# Patient Record
Sex: Female | Born: 1955 | Race: Black or African American | Hispanic: No | Marital: Married | Smoking: Never smoker
Health system: Southern US, Community
[De-identification: ages and names within clinical notes are randomized; demographics above are authoritative.]

## PROBLEM LIST (undated history)

## (undated) DIAGNOSIS — F319 Bipolar disorder, unspecified: Secondary | ICD-10-CM

## (undated) HISTORY — PX: HERNIA REPAIR: SHX51

## (undated) HISTORY — PX: TONSILLECTOMY: SUR1361

## (undated) HISTORY — PX: ABDOMINAL HYSTERECTOMY: SHX81

## (undated) HISTORY — PX: APPENDECTOMY: SHX54

---

## 1997-10-07 ENCOUNTER — Emergency Department (HOSPITAL_COMMUNITY): Admission: EM | Admit: 1997-10-07 | Discharge: 1997-10-08 | Payer: Self-pay | Admitting: Emergency Medicine

## 1997-10-08 ENCOUNTER — Emergency Department (HOSPITAL_COMMUNITY): Admission: RE | Admit: 1997-10-08 | Discharge: 1997-10-08 | Payer: Self-pay | Admitting: Emergency Medicine

## 1998-05-13 ENCOUNTER — Other Ambulatory Visit: Admission: RE | Admit: 1998-05-13 | Discharge: 1998-05-13 | Payer: Self-pay | Admitting: Internal Medicine

## 1998-05-27 ENCOUNTER — Ambulatory Visit (HOSPITAL_COMMUNITY): Admission: RE | Admit: 1998-05-27 | Discharge: 1998-05-27 | Payer: Self-pay | Admitting: Internal Medicine

## 1998-05-27 ENCOUNTER — Encounter: Payer: Self-pay | Admitting: Internal Medicine

## 1999-04-05 ENCOUNTER — Encounter: Admission: RE | Admit: 1999-04-05 | Discharge: 1999-04-05 | Payer: Self-pay | Admitting: Internal Medicine

## 1999-04-05 ENCOUNTER — Encounter: Payer: Self-pay | Admitting: Internal Medicine

## 1999-06-02 ENCOUNTER — Emergency Department (HOSPITAL_COMMUNITY): Admission: EM | Admit: 1999-06-02 | Discharge: 1999-06-03 | Payer: Self-pay | Admitting: Emergency Medicine

## 2000-03-26 ENCOUNTER — Encounter: Payer: Self-pay | Admitting: Internal Medicine

## 2000-03-26 ENCOUNTER — Encounter: Admission: RE | Admit: 2000-03-26 | Discharge: 2000-03-26 | Payer: Self-pay | Admitting: Internal Medicine

## 2001-12-01 ENCOUNTER — Inpatient Hospital Stay (HOSPITAL_COMMUNITY): Admission: EM | Admit: 2001-12-01 | Discharge: 2001-12-02 | Payer: Self-pay | Admitting: *Deleted

## 2001-12-02 ENCOUNTER — Encounter: Payer: Self-pay | Admitting: Cardiology

## 2004-11-03 ENCOUNTER — Inpatient Hospital Stay (HOSPITAL_COMMUNITY): Admission: RE | Admit: 2004-11-03 | Discharge: 2004-11-18 | Payer: Self-pay | Admitting: Psychiatry

## 2004-11-04 ENCOUNTER — Ambulatory Visit: Payer: Self-pay | Admitting: Psychiatry

## 2004-11-21 ENCOUNTER — Other Ambulatory Visit (HOSPITAL_COMMUNITY): Admission: RE | Admit: 2004-11-21 | Discharge: 2005-02-19 | Payer: Self-pay | Admitting: Psychiatry

## 2004-12-28 ENCOUNTER — Encounter: Payer: Self-pay | Admitting: Emergency Medicine

## 2004-12-28 ENCOUNTER — Inpatient Hospital Stay (HOSPITAL_COMMUNITY): Admission: AD | Admit: 2004-12-28 | Discharge: 2005-01-01 | Payer: Self-pay | Admitting: Internal Medicine

## 2004-12-28 IMAGING — CR DG CHEST 1V PORT
1 series · 1 of 1 positions shown · non-contrast
Comparison: [DATE].

CLINICAL DATA: Chest pain, shortness of breath. 
 PORTABLE CHEST ? [DATE]:

[view not recorded]
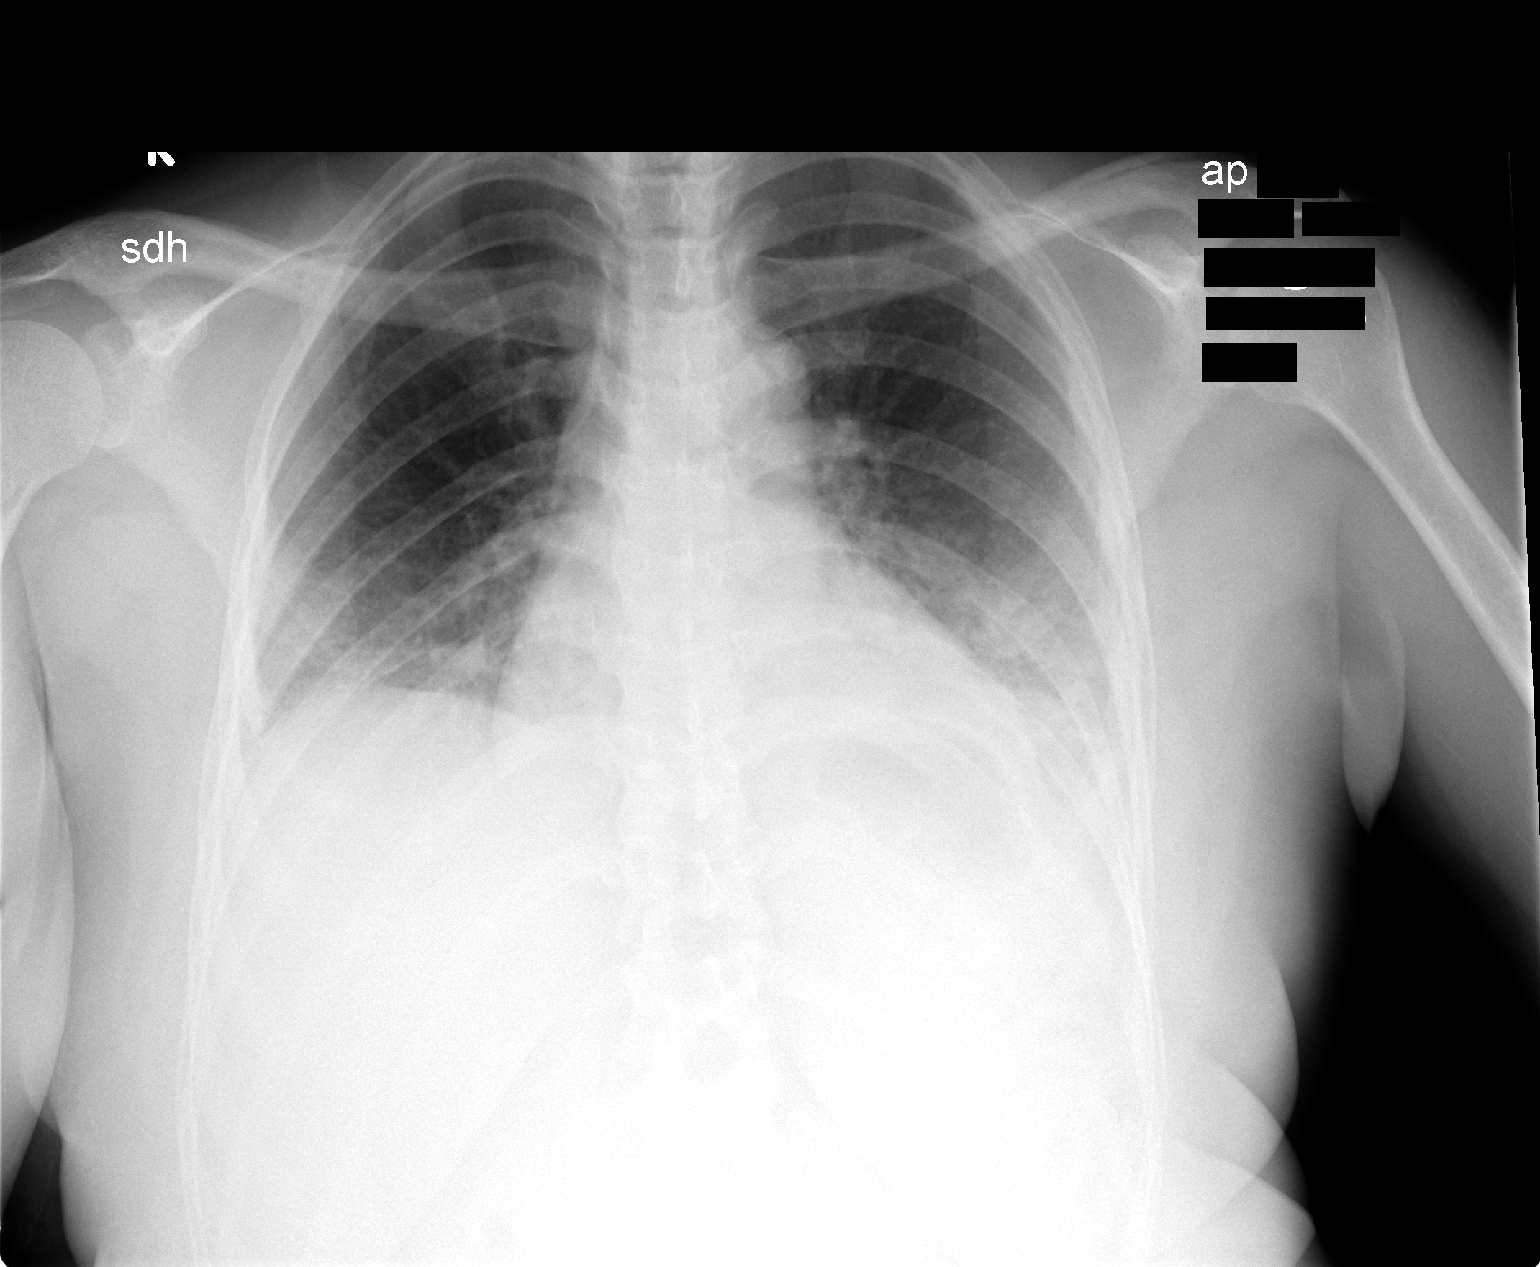

[1 of 1 positions shown; findings below may reference images not displayed]

FINDINGS: There is small bilateral pleural effusions and bibasilar airspace disease.  Cardiomegaly.  Mid and upper lung zones clear.
IMPRESSION: Findings compatible with bibasilar pneumonia.  Edema could create a similar appearance.

## 2004-12-28 IMAGING — CT CT CHEST W/ CM
1 of 2 series · 15 of 30 positions shown, 19 images · IV contrast (omnipaque)
Comparison: none

CLINICAL DATA: 49-year-old with shortness of breath. 
 CHEST CT WITH CONTRAST:
TECHNIQUE: Multidetector CT imaging of the chest was performed following the standard protocol during bolus administration of intravenous contrast.
 Contrast:  80 cc Omnipaque 300

[Series 4: thin chest 2.0 b40f st · axial · 0.55mm/px · z∈[+1181,+1428]mm · 15 of 385 slices shown, 19 images]
[im 17/385  mediastinal]
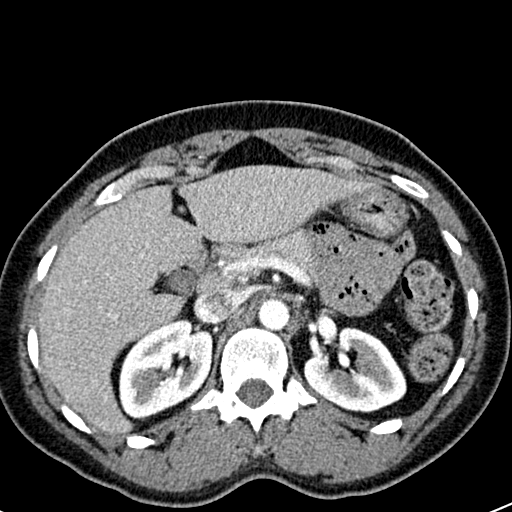
[im 17/385  lung]
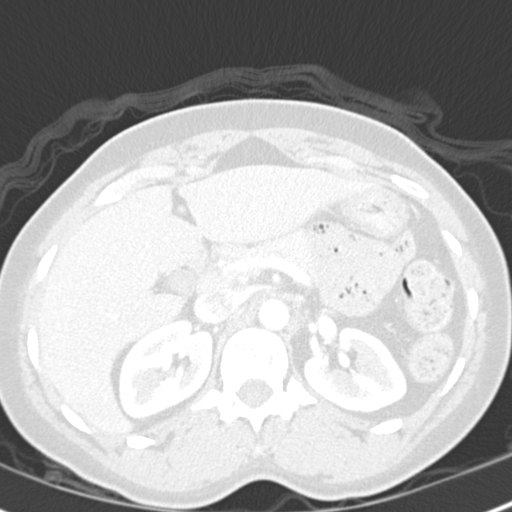
[im 49/385  lung]
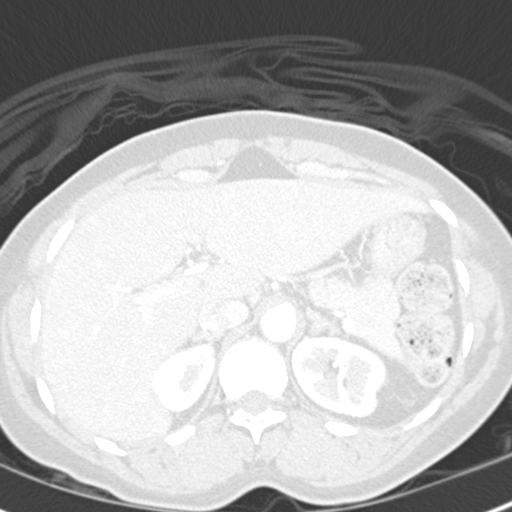
[im 81/385  lung]
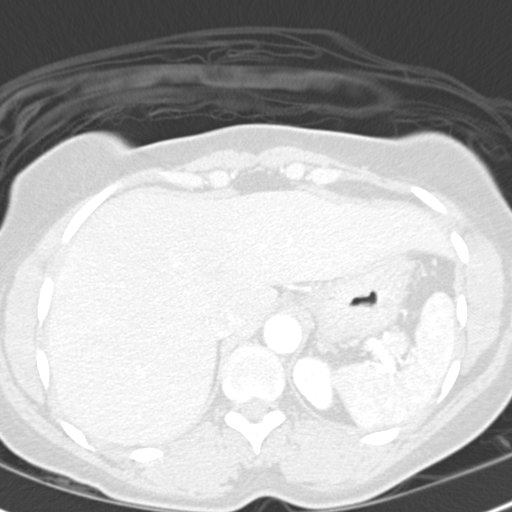
[im 97/385  lung]
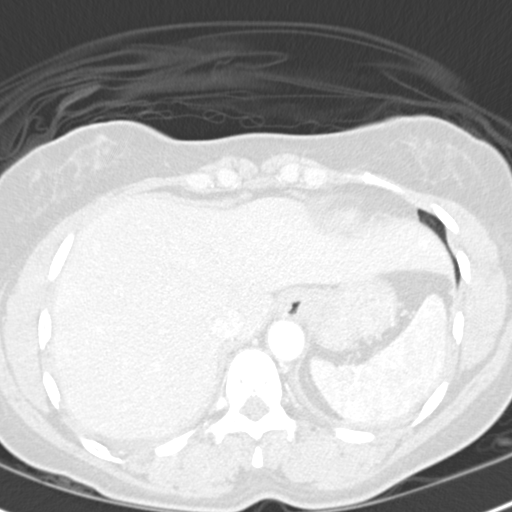
[im 129/385  mediastinal]
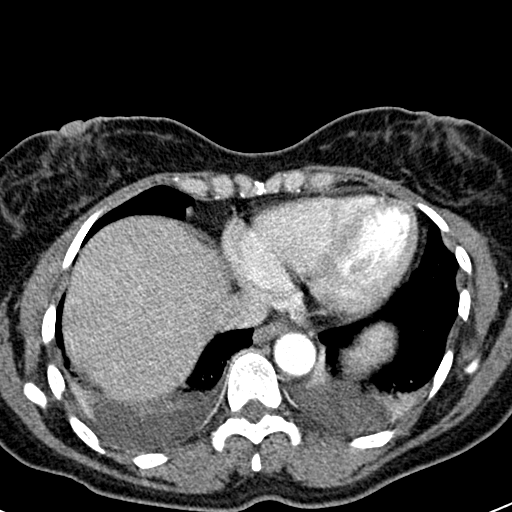
[im 129/385  lung]
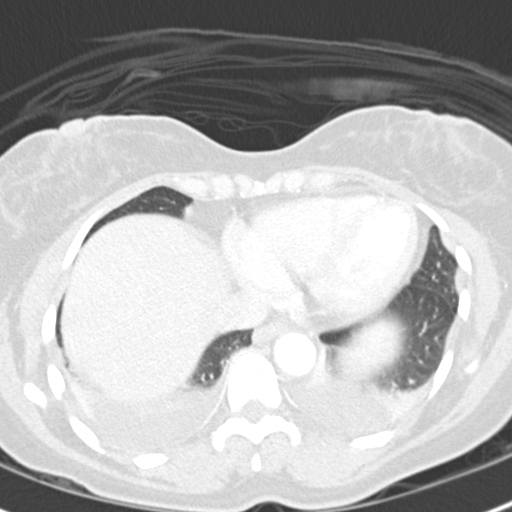
[im 145/385  lung]
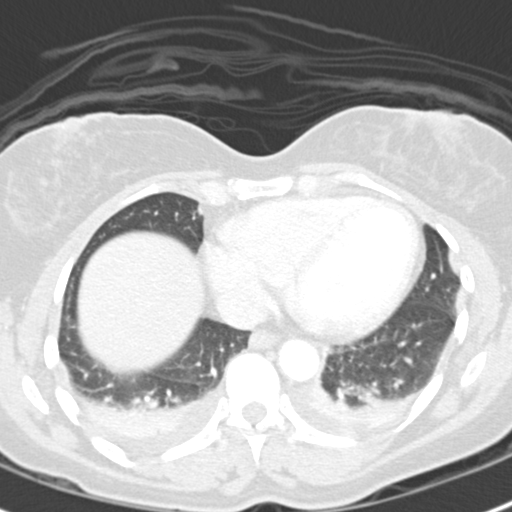
[im 177/385  lung]
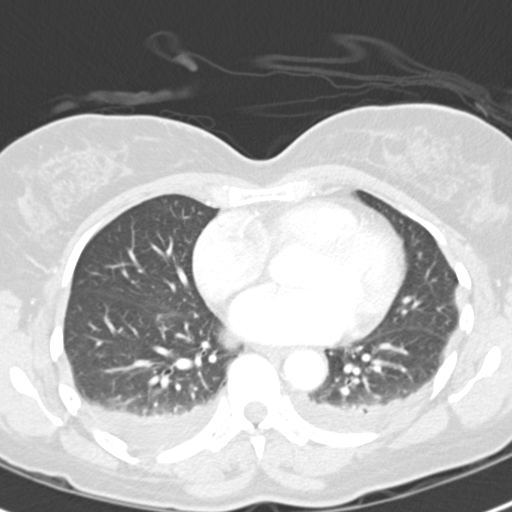
[im 193/385  lung]
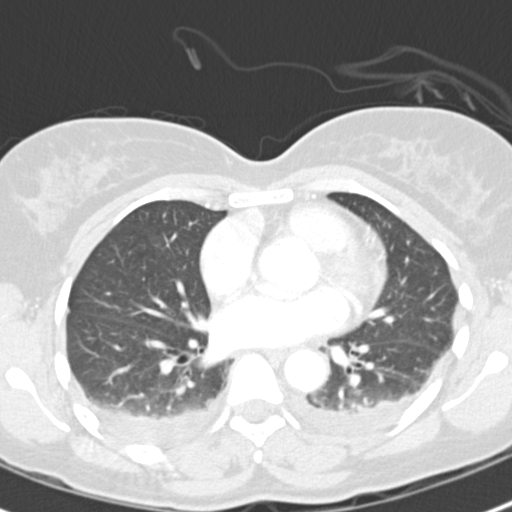
[im 209/385  mediastinal]
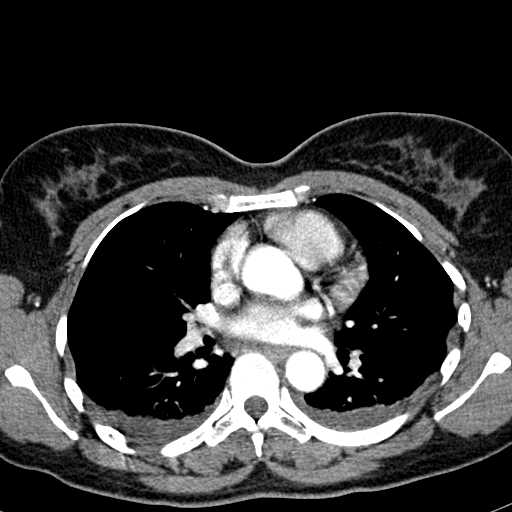
[im 209/385  lung]
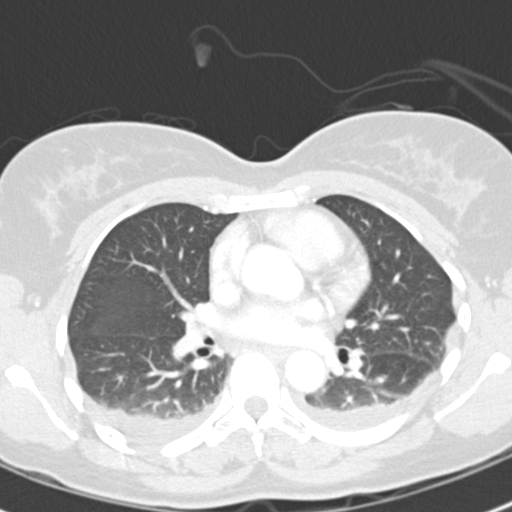
[im 241/385  lung]
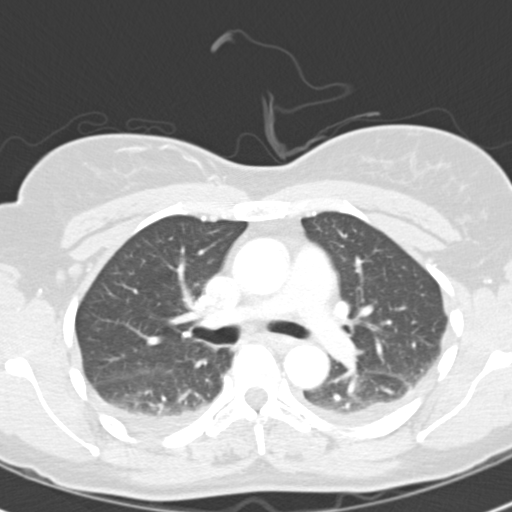
[im 257/385  lung]
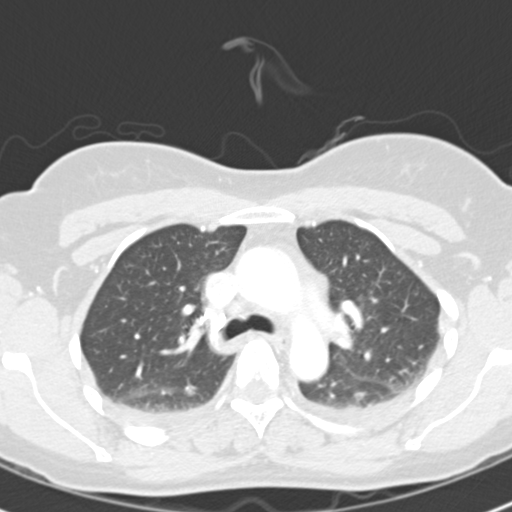
[im 289/385  lung]
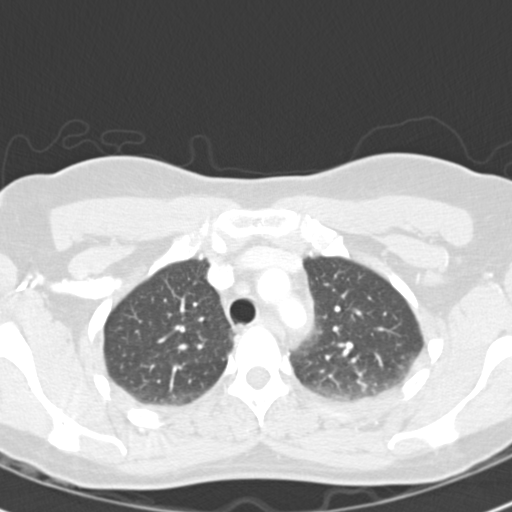
[im 321/385  mediastinal]
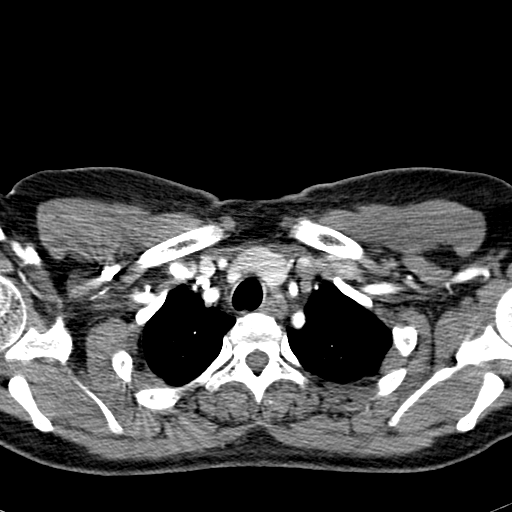
[im 321/385  lung]
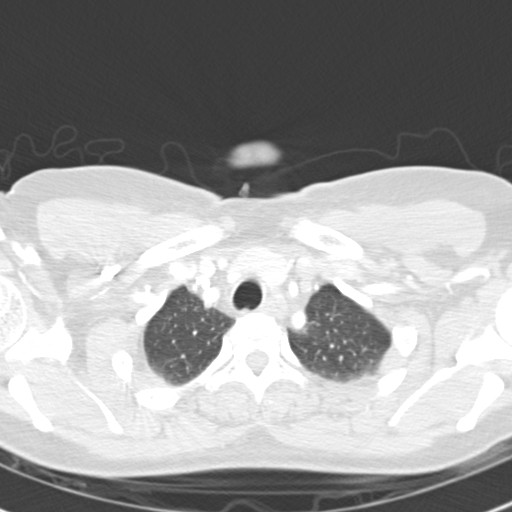
[im 337/385  lung]
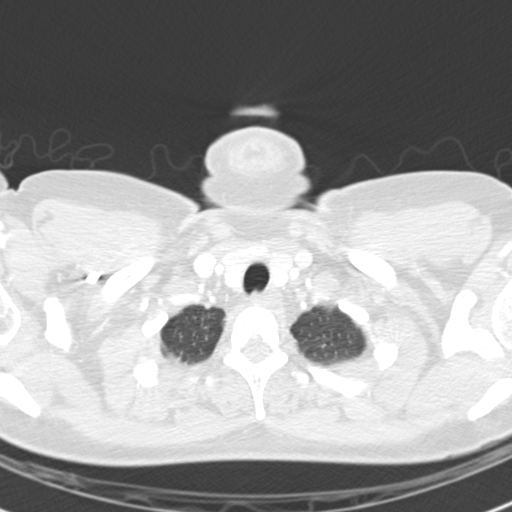
[im 369/385  lung]
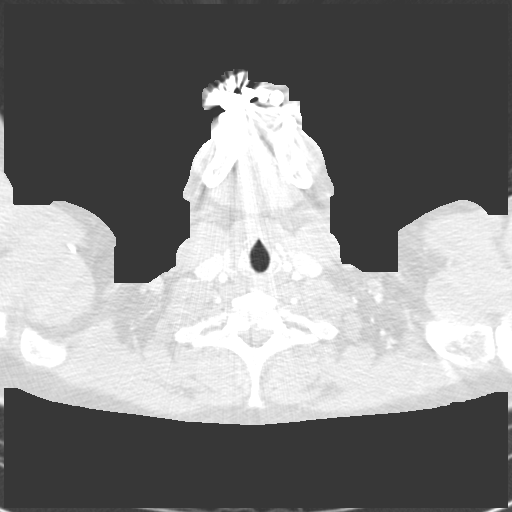

[15 of 30 positions shown; findings below may reference images not displayed]

FINDINGS: The chest wall, soft tissues, and bony structures are unremarkable.  No supraclavicular or axillary adenopathy.  Heart is within normal limits in size.  No pericardial effusion.  No mediastinal or hilar adenopathy.  The thoracic aorta is normal in caliber.  No dissection.  Esophagus is grossly normal.  
 There are small bilateral pleural effusions and overlying bibasilar atelectasis.  No edema or focal infiltrates.  No pulmonary masses or worrisome pulmonary nodules.  
 The upper abdomen demonstrates no significant findings.
IMPRESSION: 1.  Small bilateral pleural effusions and overlying bibasilar atelectasis.  No focal infiltrates or edema. 
 2.  Small hiatal hernia. 
 3.  Normal appearance of the heart and mediastinum.

## 2004-12-30 ENCOUNTER — Ambulatory Visit: Payer: Self-pay | Admitting: Cardiology

## 2004-12-30 ENCOUNTER — Encounter: Payer: Self-pay | Admitting: Cardiology

## 2004-12-31 ENCOUNTER — Ambulatory Visit: Payer: Self-pay | Admitting: Internal Medicine

## 2005-07-12 ENCOUNTER — Ambulatory Visit: Payer: Self-pay | Admitting: Family Medicine

## 2005-07-20 ENCOUNTER — Ambulatory Visit: Payer: Self-pay | Admitting: Family Medicine

## 2005-07-24 ENCOUNTER — Ambulatory Visit (HOSPITAL_COMMUNITY): Admission: RE | Admit: 2005-07-24 | Discharge: 2005-07-24 | Payer: Self-pay | Admitting: Family Medicine

## 2005-08-23 ENCOUNTER — Emergency Department (HOSPITAL_COMMUNITY): Admission: EM | Admit: 2005-08-23 | Discharge: 2005-08-23 | Payer: Self-pay | Admitting: Emergency Medicine

## 2005-11-07 ENCOUNTER — Ambulatory Visit: Payer: Self-pay | Admitting: Family Medicine

## 2005-11-08 ENCOUNTER — Ambulatory Visit: Payer: Self-pay | Admitting: *Deleted

## 2005-11-09 ENCOUNTER — Emergency Department (HOSPITAL_COMMUNITY): Admission: EM | Admit: 2005-11-09 | Discharge: 2005-11-09 | Payer: Self-pay | Admitting: Emergency Medicine

## 2006-07-30 ENCOUNTER — Ambulatory Visit: Payer: Self-pay | Admitting: Internal Medicine

## 2006-08-13 ENCOUNTER — Ambulatory Visit: Payer: Self-pay | Admitting: Internal Medicine

## 2006-10-02 ENCOUNTER — Ambulatory Visit: Payer: Self-pay | Admitting: Internal Medicine

## 2006-10-03 ENCOUNTER — Encounter (INDEPENDENT_AMBULATORY_CARE_PROVIDER_SITE_OTHER): Payer: Self-pay | Admitting: Internal Medicine

## 2006-10-03 LAB — CONVERTED CEMR LAB: TSH: 1.094 microintl units/mL (ref 0.350–5.50)

## 2006-10-30 ENCOUNTER — Encounter (INDEPENDENT_AMBULATORY_CARE_PROVIDER_SITE_OTHER): Payer: Self-pay | Admitting: Internal Medicine

## 2006-10-30 ENCOUNTER — Ambulatory Visit: Payer: Self-pay | Admitting: Family Medicine

## 2006-10-30 LAB — CONVERTED CEMR LAB
ALT: 26 units/L (ref 0–35)
AST: 24 units/L (ref 0–37)
Albumin: 4.5 g/dL (ref 3.5–5.2)
Alkaline Phosphatase: 118 units/L — ABNORMAL HIGH (ref 39–117)
CO2: 18 meq/L — ABNORMAL LOW (ref 19–32)
Glucose, Bld: 84 mg/dL (ref 70–99)
HDL: 66 mg/dL (ref >39)
Total Bilirubin: 0.4 mg/dL (ref 0.3–1.2)
Total Protein: 7.1 g/dL (ref 6.0–8.3)
Triglycerides: 120 mg/dL (ref <150)

## 2006-11-07 ENCOUNTER — Encounter (INDEPENDENT_AMBULATORY_CARE_PROVIDER_SITE_OTHER): Payer: Self-pay | Admitting: *Deleted

## 2006-11-12 ENCOUNTER — Ambulatory Visit (HOSPITAL_COMMUNITY): Admission: RE | Admit: 2006-11-12 | Discharge: 2006-11-12 | Payer: Self-pay | Admitting: Family Medicine

## 2007-03-07 ENCOUNTER — Ambulatory Visit: Payer: Self-pay | Admitting: Internal Medicine

## 2007-03-29 ENCOUNTER — Ambulatory Visit: Payer: Self-pay | Admitting: Internal Medicine

## 2007-03-29 LAB — CONVERTED CEMR LAB
AST: 25 units/L (ref 0–37)
Albumin: 4.8 g/dL (ref 3.5–5.2)
BUN: 12 mg/dL (ref 6–23)
Basophils Relative: 0 % (ref 0–1)
Creatinine, Ser: 1.2 mg/dL (ref 0.40–1.20)
Eosinophils Absolute: 0.1 10*3/uL (ref 0.0–0.7)
Eosinophils Relative: 2 % (ref 0–5)
HCT: 42.3 % (ref 36.0–46.0)
Lymphocytes Relative: 43 % (ref 12–46)
Lymphs Abs: 2.6 10*3/uL (ref 0.7–4.0)
Monocytes Absolute: 0.3 10*3/uL (ref 0.1–1.0)
Monocytes Relative: 4 % (ref 3–12)
Neutrophils Relative %: 51 % (ref 43–77)
Platelets: 285 10*3/uL (ref 150–400)
RBC: 4.89 M/uL (ref 3.87–5.11)
Sodium: 141 meq/L (ref 135–145)
Total Bilirubin: 0.4 mg/dL (ref 0.3–1.2)
Total Protein: 7.4 g/dL (ref 6.0–8.3)
VLDL: 16 mg/dL (ref 0–40)
WBC: 6.2 10*3/uL (ref 4.0–10.5)

## 2007-05-13 ENCOUNTER — Ambulatory Visit: Payer: Self-pay | Admitting: Internal Medicine

## 2009-10-12 ENCOUNTER — Emergency Department (HOSPITAL_COMMUNITY): Admission: EM | Admit: 2009-10-12 | Discharge: 2009-10-12 | Payer: Self-pay | Admitting: Emergency Medicine

## 2010-07-08 NOTE — H&P (Signed)
Brenda Nelson, Brenda Nelson                     ACCOUNT NO.:  [id number]   MEDICAL RECORD NO.:  [id number]                   PATIENT TYPE:  INP   LOCATION:  1826                                 FACILITY:  [facility]   PHYSICIAN:  Learta Codding, M.D. [facility]             DATE OF BIRTH:  05-05-1955   DATE OF ADMISSION:  12/01/2001  DATE OF DISCHARGE:                                HISTORY & PHYSICAL   CARDIOLOGIST:  Cardiologist is new - Dr. Andee Lineman.   CURRENT COMPLAINT:  Substernal chest pain starting earlier this morning.   HISTORY OF PRESENT ILLNESS:  The patient is a 55 year old African-American  female who presents to the emergency room for evaluation of chest pain.  The  patient has no prior history of cardiac disease.  She reports that she was  woken up suddenly this morning with severe 10/10 substernal chest pain which  felt very sharp in nature but then became more oppressive.  This lasted  approximately 10 minutes.  She felt like she needed to hyperventilate when  she developed these symptoms.  She did not have diaphoresis but then had  another episode a little later now associated with a pain around the breast  area and left arm.  She denied any nausea or vomiting at that time.  The  patient has been rather active but lately she does walk 2 miles a day and  reported no substernal chest pain on exertion.  On arrival to the emergency  room the patient's electrocardiogram was within normal limits and did not  demonstrate ischemia.  She was given nitroglycerin and subsequently her  blood pressure dropped into the 80s.  She also felt slightly nauseated thus,  the patient in the bed next to her was vomiting.  Subsequently, her heart  rate decreased and she appeared to have had a vasovagal episode.  The  patient currently still complains of some chest pain in the left chest with  some radiation to the back.  However, subsequent electrocardiogram was  within normal limits and her initial set  of enzymes is also within normal  limits.   ALLERGIES:  No known drug allergies.   MEDICATIONS:  None prior to admission.  The patient was given GI cocktail  and Pepcid in emergency room.   PAST MEDICAL HISTORY:  No history of diabetes or hypertension.  Does have  hypercholesterolemia. Quit tobacco 15 years ago - used to smoke half pack a  day.  Status post hysterectomy at age 46.  She had several cysts removed.  She had a history of anemia in her 71s.   SOCIAL HISTORY:  The patient lives in [place] by herself.  She has a 39-  year-old daughter.  She denies alcohol.  She walks 2 miles a day.  She works  as an Social research officer, government .   FAMILY HISTORY:  Mother had a heart attack in her 42s.  Father died at age  72 and had diabetes mellitus.  She has two sisters with diabetes mellitus  and one sister with hypertension.   REVIEW OF SYSTEMS:  She had reported some fever and sweats.  She also  reports headache.  She reported substernal chest pain with details as  outlined above and had a nonproductive cough.  She denies frequency or  dysuria.  She feels depressed at times.  She has no nausea or vomiting.  She  reports a cold intolerance.   PHYSICAL EXAMINATION:  VITAL SIGNS: Blood pressure is 106/34, heart rate is  61 beats per minute, temperature is 96.7.  GENERAL: Well-nourished African-American female in no apparent distress but  rather anxious and feeling somewhat nauseated.  HEENT: _____________ Conjunctivae clear.  NECK: Supple; no bruits or JVD.  HEART: Regular rate and rhythm; no murmurs audible.  LUNGS: Clear.  SKIN: Warm and dry.  ABDOMEN: Soft, nontender; there is no rebound or guarding; good bowel  sounds.  EXTREMITIES: Extremities 2+ peripheral pulses and no edema.  NEUROLOGICAL: The patient is grossly intact.   DIAGNOSTIC STUDIES:  1. Chest x-ray: No acute abnormalities.  No cardiomegaly.  2. EKG: Normal sinus rhythm.   LABORATORIES:  Hemoglobin is 12.9,  hematocrit is 39.5, white count is 5.0,  platelet count is 264.  BUN is 30 and creatinine is 0.8.  Potassium is 4,  glucose 94.  INR is 0.8.  CK-MB is 147, CK is 183, troponin is less than  0.01.   A 12-lead electrocardiogram had normal sinus rhythm with no acute ischemic  changes.   IMPRESSION AND PLAN:  1. Substernal chest pain.  The etiology is not entirely clear but does not     appear to be ischemic in origin.  The patient has few risk factors for     coronary artery disease.  She also has no electrocardiographic changes     and based on her enzyme markers she is at low risk.  Also, her pain is     reproducible by palpation on the chest.  However, the patient should be     observed and enzymes will be drawn as well as high sensitivity ERCP and a     BNP level.  If this is within normal limits, she can undergo an     inhospital workup for ischemia and heart disease with an exercise     Cardiolite test in the morning.  2. Hypotension.  This may well be vasodepressor in etiology, particularly as     has been associated with the slow heart rate.  Interestingly, the     patient's blood pressure is wide with a low diastolic blood pressure and     we will obtain blood cultures to rule out infection; she is currently not     febrile.  Also, pulmonary embolism will need to be ruled out and a D-     dimer also has been ordered, if positive spiral CT scan will need to be     done.  3. Rule out hypothyroidism.  A TSH will be drawn.   DISPOSITION:  The patient will be admitted, will be given IV fluids and  evaluated for hypertension and chest pain __________ as outlined above.                                               Vernie Shanks  Andee Lineman, M.D. [facility]    GED/MEDQ  D:  12/01/2001  T:  12/01/2001  Job:  [id number]   cc:   Merlene Laughter. Renae Gloss, M.D.

## 2010-07-08 NOTE — Discharge Summary (Signed)
NAMEBUFFI, EWTON           ACCOUNT NO.:  [id number]   MEDICAL RECORD NO.:  [id number]          PATIENT TYPE:  INP   LOCATION:  4730                         FACILITY:  [facility]   PHYSICIAN:  Lonia Blood, M.D.      DATE OF BIRTH:  29-Jun-1955   DATE OF ADMISSION:  12/28/2004  DATE OF DISCHARGE:  01/01/2005                                 DISCHARGE SUMMARY   DISCHARGE DIAGNOSES:  1.  Atypical chest pain, rule out myocardial infarction.  2.  Dyspnea on exertion, presumed noncardiac.  3.  Bipolar disorder.   DISCHARGE MEDICATIONS:  1.  Aspirin 325 mg daily.  2.  Geodon 80 mg daily during the day and 160 mg at night.  3.  Paxil 20 mg daily.  4.  Trazadone 200 mg at night for sleep.   DISPOSITION:  The patient is discharged in good health.   FOLLOW UP:  1.  She will followup with Dr. Renae Gloss in about 1 to 2 weeks.  2.  She is to have an outpatient stress test here at [facility].  3.  Also followup with psychiatrist of choice at mental health after      discharge.   PROCEDURE:  1.  Chest x-ray performed on December 28, 2004 showed findings compatible      with basilar pneumonia or edema, which could create some of that      appearance.  2.  Chest CT on December 28, 2004 showed small bilateral pleural effusions      and overlying bibasilar atelectasis. No focal infiltrates or edema.      Small hiatal hernia and normal appearance of the heart and mediastinum.  3.  A 2-D echocardiogram performed on December 30, 2004 showed an EF of 50%      to 55%. The study was inadequate for evaluation of left ventricular      regional wall motion.   CONSULTATIONS:  1.  Dr. Jeanie Sewer, Psychiatry.  2.  Dr. Lewayne Bunting, Cardiology.   HISTORY AND PHYSICAL:  Please refer to dictated history and physical by Dr.  Corky Downs.   HISTORY OF PRESENT ILLNESS:  The patient is a 55 year old female with known  history of bipolar disorder, recent hospitalization this November to mental  health by  Dr. Kathrynn Running, at which time she was admitted with suicidal  ideation. The patient came in secondary to shortness of breath for about 2  weeks and intermittent chest pain for about a week. She has had multiple  similar episodes before. She had a similar one in 2003 where she had a  Cardiolite test followed by a catheterization, apparently performed also as  an outpatient. The patient was subsequently admitted mainly for rule out  myocardial infarction.   HOSPITAL COURSE:  #1.  CHEST PAIN:  The chest pain was essentially atypical  for the most part. Cardiac enzymes were followed serially and they were  negative. A 2-D echocardiogram performed also showed the findings as  indicated above. We checked a BNP for possible CHF and it was less than 30.  D-dimer was normal but chest CT was performed to further  characterize the  cause of her chest pain as indicated. Subsequently, the patient was managed  conservatively. Cardiology consult was sought for. The recommendation is  that this is probably noncardiac. The patient should have an outpatient  stress test. At this point, we will discharge her home with this premise.  She has not had any chest pain for at least 3 days prior to discharge.   #2.  DYSPNEA ON EXERTION:  No clear-cut cause was found. TSH was normal at  1.686. Her hemoglobin was stable. B12 was normal at 806. As indicated, the 2-  D echocardiogram showed her EF was okay, although at the low normal level.  Chest x-ray initially suggested possible pulmonary edema but CT scan did not  show that. There are some bilateral pleural effusions but they are too small  to account for her dyspnea on exertion. This was explained to the patient  that her medications may be partly responsive being bipolar. Subsequently,  the patient was advised to follow up with mental health so her medications  could be titrated over until she gets maximum benefit.   #3.  BIPOLAR DISORDER:  The patient was on 3  medications when she came in.  Her mother and patient indicated that the patient was overmedicated. We have  asked Dr. Jeanie Sewer to see the patient and his medical assessment  recommendation included adding her adding her Paxil back and titrating the  dose, which we are doing at the moment.      Lonia Blood, M.D.  Electronically Signed     LG/MEDQ  D:  01/01/2005  T:  01/02/2005  Job:  [id number]   cc:   Doylene Canning. Ladona Ridgel, M.D.  [address]  [address]  [place]  [place] [address]   Antonietta Breach, M.D.   [name], M.D.

## 2010-07-08 NOTE — Consult Note (Signed)
Brenda Nelson, Brenda Nelson           ACCOUNT NO.:  [id number]   MEDICAL RECORD NO.:  [id number]          PATIENT TYPE:  INP   LOCATION:  4730                         FACILITY:  [facility]   PHYSICIAN:  Doylene Canning. Ladona Ridgel, M.D.  DATE OF BIRTH:  1956/02/15   DATE OF CONSULTATION:  12/31/2004  DATE OF DISCHARGE:  01/01/2005                                   CONSULTATION   REFERRING SERVICE:  [facility].   INDICATION FOR CONSULTATION:  Evaluation of chest pain and shortness of  breath.   HISTORY OF PRESENT ILLNESS:  The patient is a 55 year old woman with a  history of hypertension and chest discomfort in the past, also with a  history of bipolar disorder, who was admitted to the hospital with worsening  chest pain and shortness of breath.  The patient has a known history of  bipolar disorder.  She states that her initial problems with chest pain  began approximately 5 years ago.  At that time, she underwent stress testing  and subsequently outpatient catheterization at the [facility] and  Vascular Outpatient Cath Lab.  I do not have details of this, but the  patient was told that there was no significant problem.  She was in her  usual state of health until admission December 29, 2004; at that time she  noted dyspnea along with 1 episode of substernal chest pain.  It was not  related to exertion.  It was not associated with diaphoresis, nausea or  vomiting.  There was associated shortness of breath and the pain was short-  lasting and has now resolved.  Subsequent admission demonstrated cardiac  enzymes which were negative for coronary ischemia.  A BNP was negative at  less than 100.  Her D-dimer was also negative.  The patient underwent CT of  the chest which demonstrated small bilateral pleural effusions and bibasilar  atelectasis.  The mediastinum was otherwise normal, as was the cardiac  silhouette.  The patient denies fevers or chills.   PAST MEDICAL HISTORY:   Her past medical history is notable for bipolar  disorder and chest pain as noted before.  There is a questionable history of  hypertension.   FAMILY HISTORY:  Noncontributory.   SOCIAL HISTORY:  The patient denies tobacco or ethanol use.   REVIEW OF SYSTEMS:  The review of systems is negative except as noted in the  HPI.  All other systems were reviewed and found to be negative.   PHYSICAL EXAMINATION:  GENERAL:  The patient is a pleasant middle-aged woman  in no distress.  VITAL SIGNS:  Blood pressure was 100/60, the pulse was 68 and regular,  respirations were 16, temperature was 98, oxygen saturation 96%.  The height  was 5 feet 4 inches and the weight was 160 pounds.  HEENT:  Normocephalic and atraumatic.  Pupils were equal and round.  The  oropharynx was moist.  The sclerae were anicteric.  NECK:  The neck revealed no jugular venous distention.  There was no  thyromegaly.  The trachea was midline.  CARDIOVASCULAR:  Exam revealed a regular rate and rhythm with  normal S1 and  S2.  There were no murmurs, rubs, or gallops present.  ABDOMEN:  Soft, nontender and non-distended.  There was no organomegaly.  Bowel sounds were present and there was no rebound or guarding.  EXTREMITIES:  Extremities demonstrated no cyanosis, clubbing or edema.  The  pulses were 2+ and symmetric.  NEUROLOGIC:  Alert and oriented x3 with cranial nerves intact.  The strength  was 5/5 and symmetric.   LABORATORY AND ACCESSORY CLINICAL DATA:  The EKG demonstrates sinus rhythm  with normal axis and intervals.  There are nonspecific T wave abnormalities.   IMPRESSION:  1.  Chest pain with mostly atypical features with coronary disease and a      reported negative stress test and catheterization 5 years ago.  2.  Dyspnea on exertion of unknown etiology with negative CT of the chest.  3.  Bipolar disorder.   RECOMMENDATIONS:  I would agree that the patient is low risk for cardiac  complications and would  recommend an outpatient stress test at [facility].           ______________________________  Doylene Canning. Ladona Ridgel, M.D.     GWT/MEDQ  D:  12/31/2004  T:  01/02/2005  Job:  [id number]

## 2010-07-08 NOTE — Discharge Summary (Signed)
NAMEMARENDA, ACCARDI                     ACCOUNT NO.:  [id number]   MEDICAL RECORD NO.:  [id number]                   PATIENT TYPE:  INP   LOCATION:  2019                                 FACILITY:  [facility]   PHYSICIAN:  Delton See, P.A. [facility]            DATE OF BIRTH:  1955/03/22   DATE OF ADMISSION:  12/01/2001  DATE OF DISCHARGE:                           DISCHARGE SUMMARY - REFERRING   HISTORY OF PRESENT ILLNESS:  The patient is a 55 year old female with no  previous history of coronary artery disease, who presented to the [place] for an evaluation of chest pain.  The  pain woke her at approximately 5 a.m. on the morning of admission.  She also  had a similar episode on the Tuesday prior to that Saturday.  She states  that she felt bad all day.  She also had sharp pain in her feet x2 weeks.  The pain that awoke her on the morning of admission was described as  substernal and rated as a 10 on a 1/10 scale, associated with mild shortness  of breath.  No diaphoresis, but mild nausea.  The pain also radiated to her  breasts, and her arms felt tingly.  Initially the pain was sharp and later  it became a pressure sensation.  The patient's pain waxed and waned until  she came to the emergency room, where she did receive some relief with  nitroglycerin; however, she continued to have residual pain after the  nitroglycerin.  She was placed on IV nitroglycerin and given morphine, with  improvement in her symptoms.  She was treated with Lovenox anticoagulation.   PAST MEDICAL HISTORY:  Negative for diabetes mellitus, negative for  hypertension.  The patient does have a history of elevated cholesterol  levels.  She quit smoking 15 years ago.  She smoked 1/2 pack per week.  She  had a history of anemia in her 75s.   PAST SURGICAL HISTORY:  1. is status post hysterectomy at age 76.  2. She has had cysts removed.   ALLERGIES:  No known drug  allergies.   MEDICATIONS:  None prior to admission.   SOCIAL HISTORY:  The patient lives in [place] alone.  She has a 28-year-  old daughter, as well as a grandson.  She has a remote tobacco history.  No  alcohol history.  She walks two miles per day for exercise, and is able to  do this without chest pain.  She works as an Engineer, structural.   FAMILY HISTORY:  Her mother is in her 34s, and had a myocardial infarction  approximately three years ago.  Her father died in his 52s of complications  of diabetes mellitus.  She has two sisters with diabetes mellitus.  One  brother with a permanent pacemaker.  One sister with hypertension.   HOSPITAL COURSE:  As noted, this patient was admitted for further evaluation  of chest pain.  Her cardiac enzymes were found to be negative.  She  continued to have intermittent pain as well as mildly low blood pressure,  but no electrocardiogram changes.  The patient was scheduled for an exercise Cardiolite on December 02, 2001.  Her target heart rate was 146 beats per minute.  The patient was only able  to exercise for four minutes and 30 seconds, when she developed leg cramps.  She was no where her target heart rate.  The test was concluded.  She was  changed to an adenosine Cardiolite.  The adenosine Cardiolite was performed  without significant problems, although her blood pressure was mildly low  during this study.  It was in the 90 range.  She was given IV fluids to  improve her hydration status and her blood pressure.  The Cardiolite came back as negative for ischemia, with an ejection fraction  of 755.  Arrangements were made to discharge her in an improved and stable  condition.   LABORATORY DATA:  As noted, the cardiac enzymes were negative.  A lipid  profile is pending at the time of this dictation.  A high sensitive CRP is  also pending.  A comprehensive metabolic panel was within normal limits  except for an albumin of 3.4.  INR  was 0.8, PTT 26.  A CBC was within normal  limits.  A chest x-ray showed no active disease.   DISCHARGE MEDICATIONS:  None.   ACTIVITY:  As tolerated.   DIET:  The patient was told to stay on a low-salt, low-fat diet.   FOLLOW UP:  She is to follow up with Dr. Merlene Laughter. Renae Gloss, and told to  call for an appointment to further evaluate her symptoms.  She is to see Dr.  Learta Codding as needed.   PROBLEMS AT TIME OF DISCHARGE:  1. Chest pain with negative enzymes.  2. Adenosine Cardiolite performed on December 02, 2001, negative for     ischemia, ejection fraction of 75%.  3. Lipid panel pending.  4. Status post hysterectomy.  5. Remote tobacco history.                                               Delton See, P.A. [facility]    DR/MEDQ  D:  12/02/2001  T:  12/03/2001  Job:  [id number]   cc:   Merlene Laughter. Renae Gloss, M.D.

## 2010-07-08 NOTE — H&P (Signed)
[name], Nelson           ACCOUNT NO.:  [id number]   MEDICAL RECORD NO.:  [id number]          PATIENT TYPE:  INP   LOCATION:  NA                           FACILITY:  [facility]   PHYSICIAN:  Mobolaji B. Bakare, M.D.DATE OF BIRTH:  07-28-1955   DATE OF ADMISSION:  DATE OF DISCHARGE:                                HISTORY & PHYSICAL   PRIMARY CARE PHYSICIAN:  Merlene Laughter. Renae Gloss, M.D.   CHIEF COMPLAINT:  1.  Shortness of breath for two weeks.  2.  Chest pain intermittently for one week.   HISTORY OF PRESENTING COMPLAINT:  [name] is a 55 year old African-  American female with history of bipolar disorder, currently on therapy.  She  started experiencing dyspnea on exertion about 2-3 weeks ago, with activity.  She described orthopnea.  She uses three pillows and paroxysmal nocturnal  dyspnea with some coughing, predominantly at night.  There has been no pedal  edema.  She does not have any history of heart disease.  She started  experiencing chest pain five days ago.  She has had three episodes since  then.  Today, she was at therapist's office when she developed left-sided  chest pain with radiation to her arms.  She denies diaphoresis,  palpitations, nausea or vomiting.  Chest pain was stated to be 8/10 and it  resolved spontaneously by the time she arrived in the emergency department.  She was brought to the emergency room by the EMS.  The patient stated that  she did not receive any formal treatment and did not have record of EMS in  the chart.  In any case, at this point, she still has chest pain which she  rated as 4/10.  Earlier today when she had chest pain, it was accompanied by  shortness of breath.   She had similar chest pain in 2003 and she was evaluated with a Cardiolite  stress test which turned out negative.   REVIEW OF SYSTEMS:  CONSTITUTIONAL:  There is no fever, no chill, no  abdominal pain, nausea or vomiting, no dysuria or urgency.   PAST MEDICAL  HISTORY:  Bipolar disorder.   PAST SURGICAL HISTORY:  1.  Hysterectomy at the age of 24 years for vaginal bleeding.  2.  She had an appendectomy in the eighth grade.   MEDICATIONS:  1.  Lamictal 200 mg p.o. q.h.s.  2.  Geodon 80 mg p.o. q.a.m. and 80 mg two tablets p.o. q.h.s.  3.  Trazodone 200 mg p.o. q.h.s.   ALLERGIES:  NO KNOWN DRUG ALLERGIES.   FAMILY HISTORY:  Mother has history of hypertension and has heart disease.  She had a heart attack two years ago.  Sister has hypertension.  Father died  of complication of diabetes mellitus.  She is single, has one daughter who  is 63 years old.   SOCIAL HISTORY:  She never drank alcohol and she never smoked cigarettes.  She does not drink alcohol.   PHYSICAL EXAMINATION:  VITAL SIGNS:  Temperature 97, blood pressure 114/71,  pulse 72, respiratory rate 20, O2 saturation of 96% on room air.  GENERAL:  Exam  reveals the patient is comfortable.  She looks depressed, not  in respiratory distress.  HEENT:  Normocephalic and atraumatic head.  Pupils equal, round and reactive  to light.  Extraocular muscle movements intact.  NECK:  No carotid bruits, no elevated JVD.  LUNGS:  Clear clinically to auscultation.  CVS:  S1 and S2 regular.  No murmur, no gallop, no rub.  ABDOMEN:  Not distended, soft, nontender.  There is an infraumbilical scar.  No hepatomegaly or organomegaly.  Bowel sounds present.  EXTREMITIES:  No pedal edema.  No calf tenderness.  CNS:  No focal neurological deficits.  SKIN:  No rashes or petechiae.   EKG:  Normal sinus rhythm with normal intervals.   Chest x-ray:  Findings are compatible with bibasilar pneumonia.  Edema could  give similar appearance.   LABORATORY DATA:  BNP is 1130.  White cell count 7.4, hemoglobin 13.3,  hematocrit 40.5, platelets 257,000, neutrophils 84%, lymphocytes 10%.  Sodium 130, potassium 4.0, chloride 95, CO2 26, glucose 125, BUN 9,  creatinine 1.2, calcium 9.2.  D-dimer 0.32.  Cardiac  enzymes at the point of  care are negative.   ASSESSMENT AND PLAN:  1.  [name] is a 55 year old African-American female with history of      bipolar disorder, presenting with shortness of breath and dyspnea on      exertion, and chest pain.  She has a low risk for myocardial ischemic.      Chest x-ray shows features compatible with edema versus pneumonia.  The      patient is afebrile and does not have leukocytosis.  Clinically, she      does not appear to have pneumonia.  Given her history of dyspnea on      exertion and chest x-ray findings, we will proceed with a CAT scan of      the chest to further evaluate this.  2.  Chest pain.  We will admit to telemetry and rule out myocardial      infarction.  Start on aspirin 325 mg p.o. daily and add Lopressor 12.5      mg p.o. b.i.d., nitro paste 1/2 inch q.4h., check fasting lipid profile,      serial cardiac enzymes, TSH.  3.  Bipolar disorder.  Resume Lamictal, Geodon and Trazodone.      [name], M.D.  Electronically Signed     MBB/MEDQ  D:  12/28/2004  T:  12/28/2004  Job:  [id number]   cc:   Merlene Laughter. Renae Gloss, M.D.  Fax: [phone]

## 2010-07-08 NOTE — Discharge Summary (Signed)
NAMEPEARLEAN, Brenda NO.:  [id number]   MEDICAL RECORD NO.:  [id number]          PATIENT TYPE:  IPS   LOCATION:  0303                          [facility]ILITY:  [facility]   PHYSICIAN:  Jeanice Lim, M.D. DATE OF BIRTH:  02-Jul-1955   DATE OF ADMISSION:  11/03/2004  DATE OF DISCHARGE:  11/18/2004                                 DISCHARGE SUMMARY   IDENTIFYING DATA:  This 55 year old divorced African-American female  voluntarily admitted, referred by psychotherapist after 2 weeks of suicidal  ideation, sleeping less than 2 hours a night, too tired to go on, does not  feel like she can live any longer, wants to die.  Family stress, conflict  for 2 years over care of blind father, now deceased.  Contemplating means  for suicide.  Unable to work for a month, with impaired concentration and  attention, thought blocking.   PAST PSYCHIATRIC HISTORY:  Sees Dr. __________, primary care physician.  First inpatient admission, no prior psychiatric admissions.  First diagnosed  with depression 8 years ago.   Past medications:  Paxil and Lexapro.  No previous antipsychotics or mood  stabilizers or ECT.   MEDICATIONS:  Lexapro 20 mg daily.   ALLERGIES:  No known drug allergies.   PHYSICAL AND NEUROLOGICAL EXAMINATION:  Within normal limits.   ROUTINE ADMISSION LABS:  Within normal limits.   MENTAL STATUS EXAM:  Fully alert, psychomotor slowing, affective flattening,  cooperative.  Speech:  Some response latency, alogia, soft tone, almost  inaudible.  Mood depressed and hopeless.  Thought processes:  Positive  thought blocking, positive suicidal ideation, unable to construct a clear  plan, but has been working on it.  Poverty of thought, neurovegetatively  depressed.   ADMISSION DIAGNOSES:  AXIS I:  Major depressive disorder, severe, recurrent  with psychotic features.  AXIS II:  Deferred.  AXIS III:  None.  AXIS IV:  Moderate to severe stressors with family conflict.  AXIS  V:  28/65.   HOSPITAL COURSE:  The patient was admitted and ordered routine p.r.n.  medications, underwent further monitoring, was monitored medically.  EKG,  hemoglobin A1C, lipid panel, psychiatric assessment, safety precautions,  routine p.r.n.'s and case management assistance.  Family session was ordered  to mobilize support system.  The patient reported still having some  difficulty sleeping.  The patient refused to interact initially, refused to  answer questions but would speak to some staff, reporting on family session.  The patient was optimized on Risperdal, decreased on Lexapro secondary to  psychotic symptoms, reporting command hallucinations but able to contract  for safety in the hospital as long as closely watched.  The patient reported  a slight decrease in voices, was less profoundly depressed, still irritable,  with suicidal thoughts as well as voices telling her to kill herself.  Female voice, increased at night.  The patient gradually reported a positive  response to medication changes.  EKG showed a first degree heart block, no  history of cardiac problems.  Reviewed by Dr. Rito Ehrlich, who felt that this  was not significant and to repeat it in a few days.  EKG showed normal sinus  rhythm.  The patient asymptomatic.  The patient was continued to be  optimized on medications and closely monitored regarding safety, still  complaining of auditory and visual hallucinations but reported feeling a  little safer and more goal directed as she was stabilized on medications,  reported gradual resolution of psychotic symptoms except for mild paranoid  ideation, no overt psychotic symptoms, no command hallucinations, no  suicidal or homicidal ideation and mood stable with improved judgment and  insight.  The patient was discharged in this improved condition with no side  effects from the medications.  The patient again was given medication  education.  Risk/benefit ratio and  alternative treatments were discussed  regarding medications and the patient was discharged on:  1.  Depakote ER 500 mg 7p.m.  2.  Trazodone 100 mg 1-1/2 at 7 p.m.  3.  Paxil 20 mg daily.  4.  Geodon 80 mg twice a day with food.  5.  Cogentin 0.5 mg b.i.d.  6.  Ativan 0.5 one-half 3 times a day p.r.n.   DISPOSITION:  The patient was to follow up with Dr. Tomasa Rand Monday,  October 9 at 1:30 and with Tamala Fothergill Tuesday, October 4 at 4 p.m., and  [facility], Monday, October 2 at 9 a.m.   DISCHARGE DIAGNOSES:  AXIS I:  Major depressive disorder, severe, recurrent  with psychotic features.  AXIS II:  Deferred.  AXIS III:  None.  AXIS IV:  Moderate to severe stressors with family conflict.  AXIS V:  Global assessment of functioning on discharge was 55.      Jeanice Lim, M.D.  Electronically Signed     JEM/MEDQ  D:  12/21/2004  T:  12/22/2004  Job:  [id number]

## 2010-12-24 ENCOUNTER — Other Ambulatory Visit: Payer: Self-pay

## 2010-12-24 ENCOUNTER — Emergency Department (HOSPITAL_COMMUNITY)
Admission: EM | Admit: 2010-12-24 | Discharge: 2010-12-25 | Disposition: A | Discharge status: Home / Self Care | Payer: Medicare Other | Attending: Emergency Medicine | Admitting: Emergency Medicine

## 2010-12-24 DIAGNOSIS — R079 Chest pain, unspecified: Secondary | ICD-10-CM | POA: Insufficient documentation

## 2010-12-24 DIAGNOSIS — F319 Bipolar disorder, unspecified: Secondary | ICD-10-CM | POA: Insufficient documentation

## 2010-12-24 DIAGNOSIS — Z79899 Other long term (current) drug therapy: Secondary | ICD-10-CM | POA: Insufficient documentation

## 2010-12-25 ENCOUNTER — Emergency Department (HOSPITAL_COMMUNITY): Payer: Medicare Other

## 2010-12-25 LAB — DIFFERENTIAL
Basophils Absolute: 0 K/uL (ref 0.0–0.1)
Basophils Relative: 1 % (ref 0–1)
Eosinophils Absolute: 0.2 10*3/uL (ref 0.0–0.7)
Eosinophils Relative: 4 % (ref 0–5)
Lymphocytes Relative: 50 % — ABNORMAL HIGH (ref 12–46)
Lymphs Abs: 2.9 10*3/uL (ref 0.7–4.0)
Monocytes Absolute: 0.4 K/uL (ref 0.1–1.0)
Monocytes Relative: 7 % (ref 3–12)
Neutro Abs: 2.3 K/uL (ref 1.7–7.7)
Neutrophils Relative %: 39 % — ABNORMAL LOW (ref 43–77)

## 2010-12-25 LAB — POCT I-STAT, CHEM 8
Chloride: 107 mEq/L (ref 96–112)
Creatinine, Ser: 1.3 mg/dL — ABNORMAL HIGH (ref 0.50–1.10)
Glucose, Bld: 104 mg/dL — ABNORMAL HIGH (ref 70–99)
HCT: 38 % (ref 36.0–46.0)
Hemoglobin: 12.9 g/dL (ref 12.0–15.0)
Sodium: 140 mEq/L (ref 135–145)
TCO2: 24 mmol/L (ref 0–100)

## 2010-12-25 LAB — CBC
HCT: 37.7 % (ref 36.0–46.0)
Hemoglobin: 12.5 g/dL (ref 12.0–15.0)
MCH: 28 pg (ref 26.0–34.0)
MCHC: 33.2 g/dL (ref 30.0–36.0)
MCV: 84.5 fL (ref 78.0–100.0)
Platelets: 241 10*3/uL (ref 150–400)
RBC: 4.46 MIL/uL (ref 3.87–5.11)
RDW: 14.6 % (ref 11.5–15.5)
WBC: 5.8 K/uL (ref 4.0–10.5)

## 2010-12-25 LAB — CK TOTAL AND CKMB (NOT AT ARMC)
CK, MB: 3 ng/mL (ref 0.3–4.0)
CK, MB: 3.2 ng/mL (ref 0.3–4.0)
Relative Index: 0.9 (ref 0.0–2.5)
Relative Index: 1 (ref 0.0–2.5)
Total CK: 330 U/L — ABNORMAL HIGH (ref 7–177)
Total CK: 322 U/L — ABNORMAL HIGH (ref 7–177)

## 2010-12-25 LAB — TROPONIN I
Troponin I: <0.3 ng/mL (ref <0.30)
Troponin I: <0.3 ng/mL (ref <0.30)

## 2010-12-25 IMAGING — CR DG CHEST 2V
2 series · 2 of 2 positions shown · non-contrast
Comparison: [DATE] CT

CLINICAL DATA: Right-sided chest pain.

CHEST - 2 VIEW

[w chest pa]
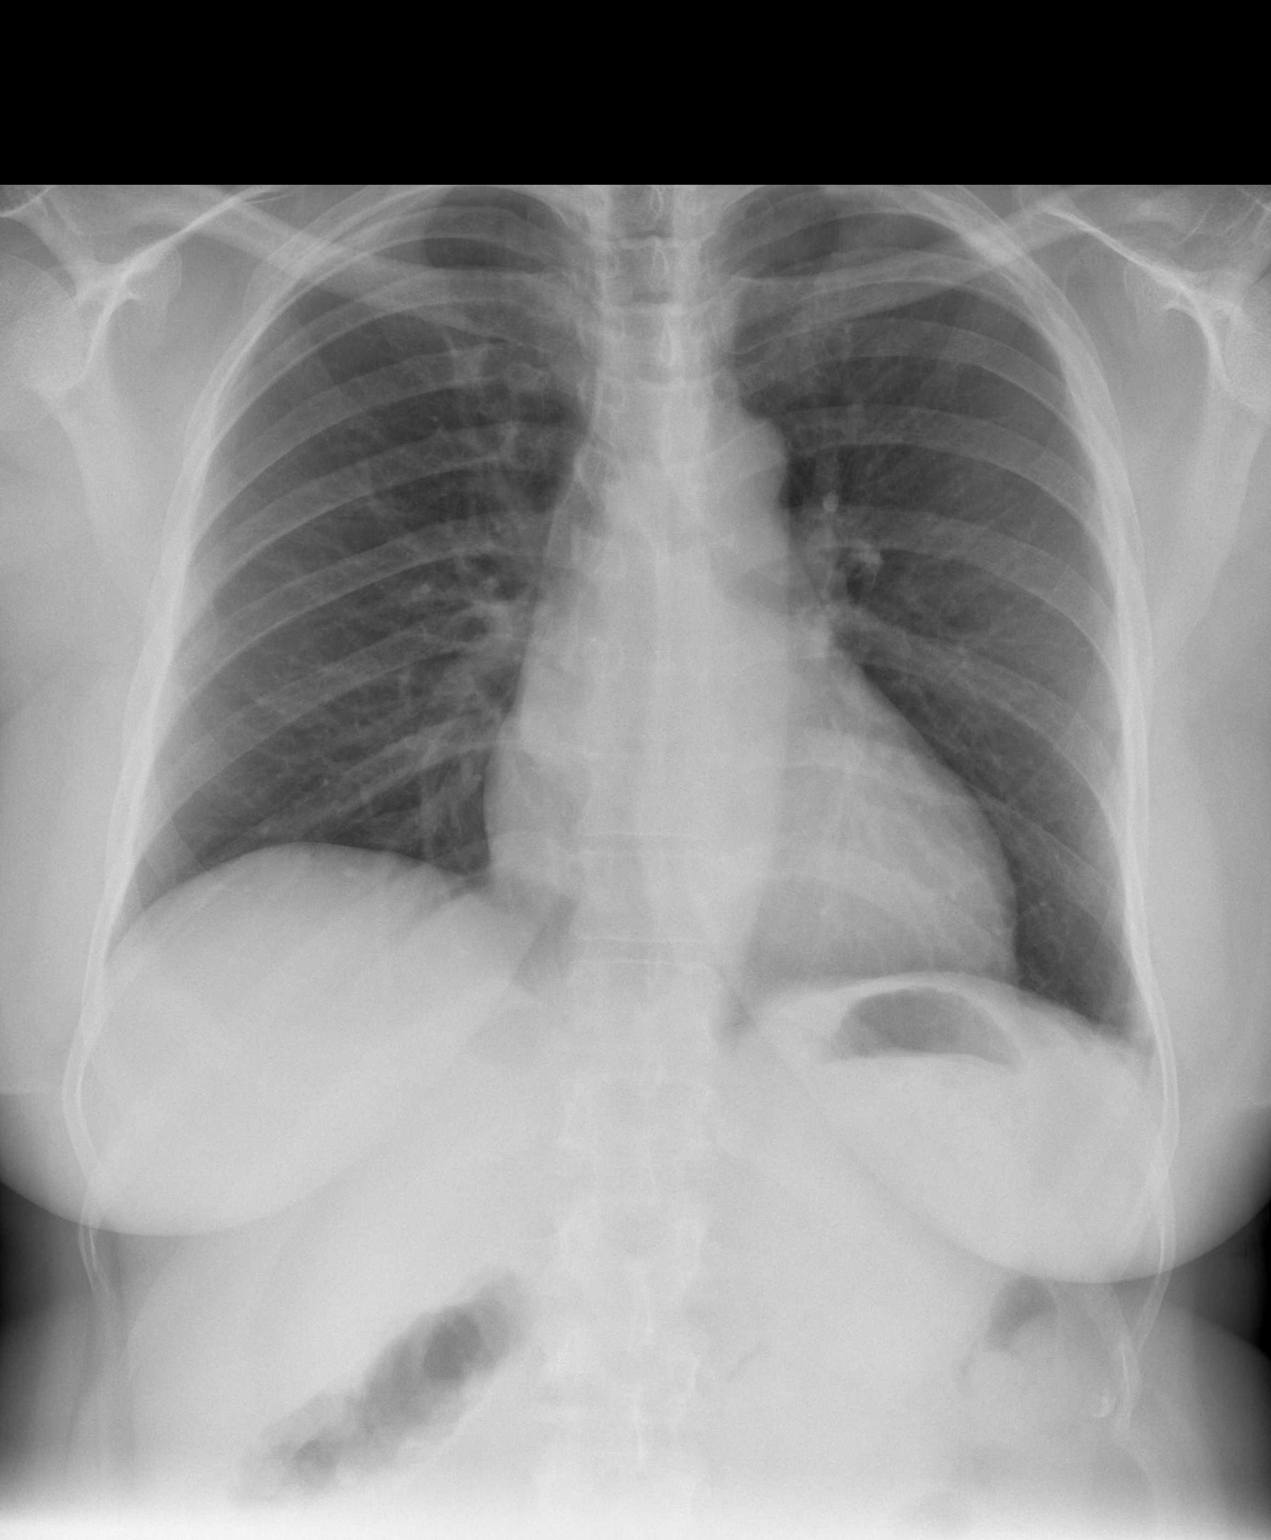

[w chest lat]
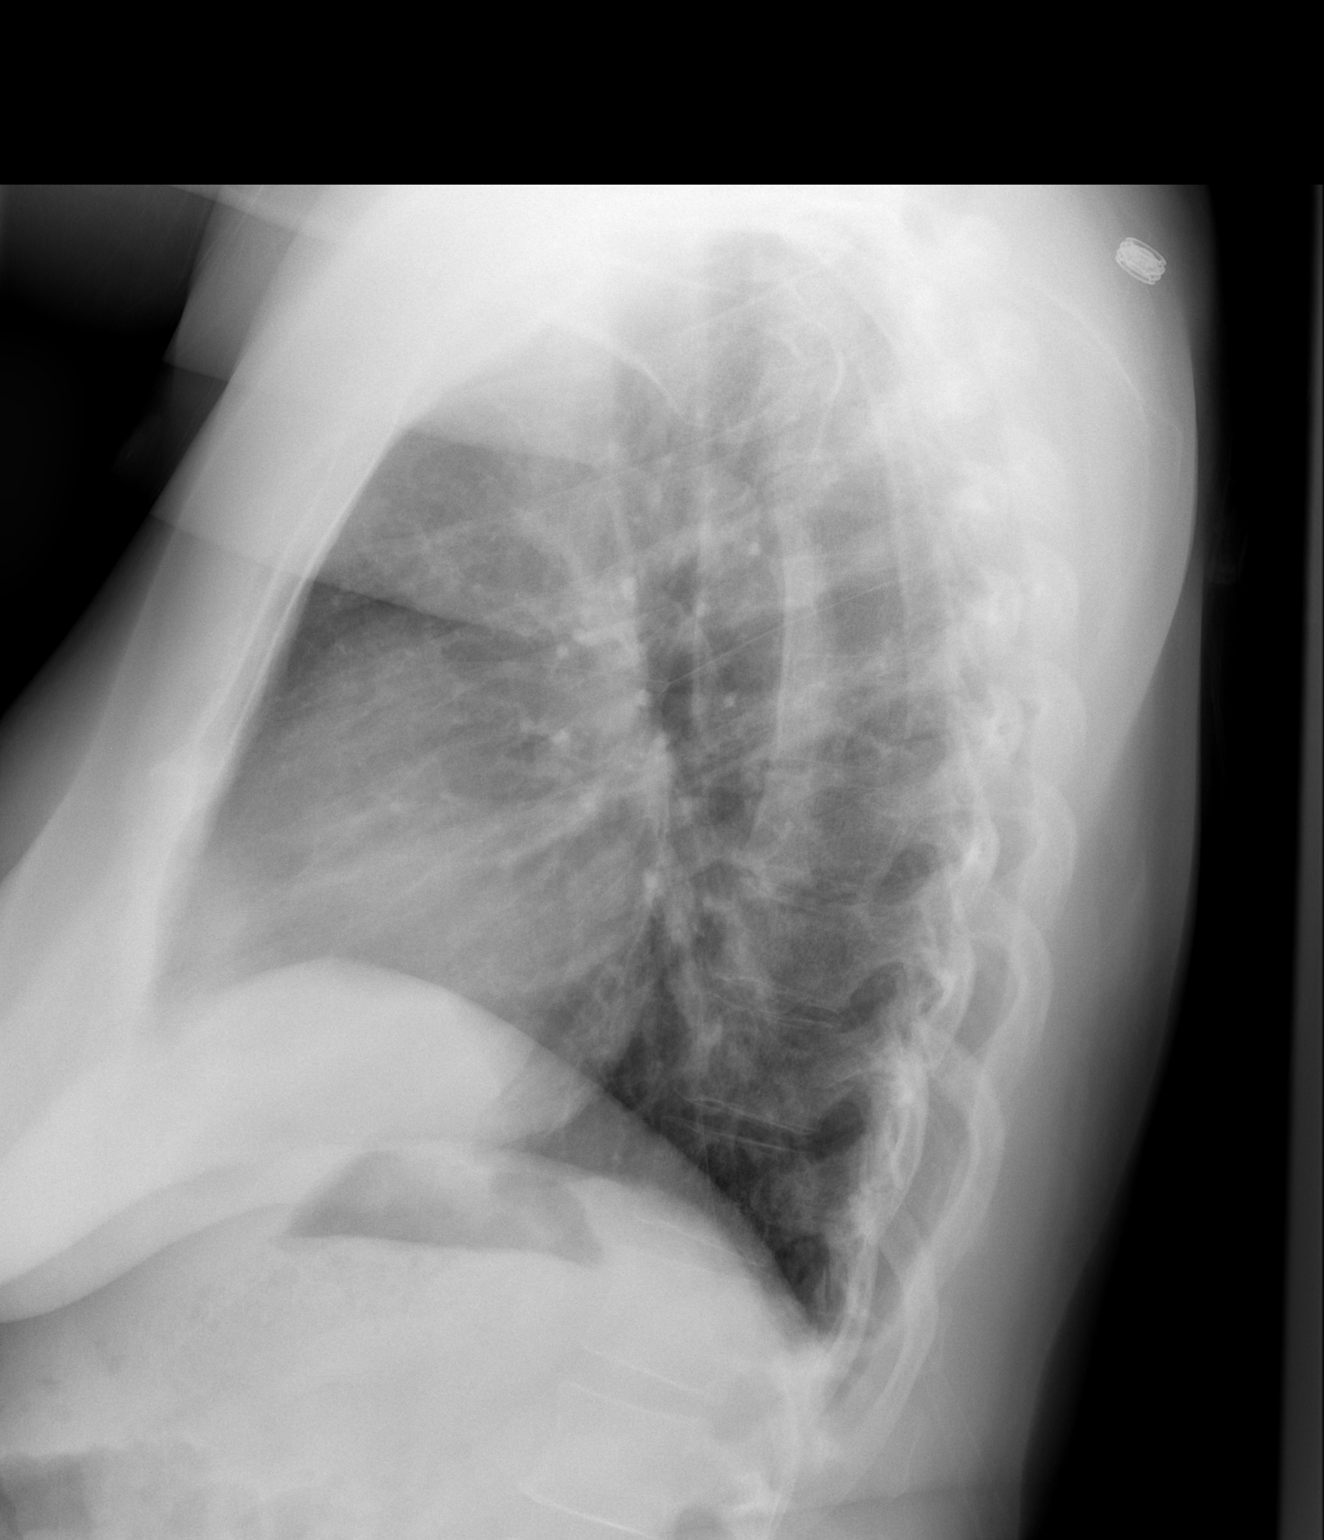

[2 of 2 positions shown; findings below may reference images not displayed]

FINDINGS: Mild left lung base scarring versus atelectasis.  Mild
interstitial prominence.  No focal consolidation otherwise.  No
pleural effusion or pneumothorax identified.  The heart size is
within the upper normal limits.  Mediastinal contours are within
normal limits.  No acute osseous abnormality.
IMPRESSION: Minimal left lung base opacity is likely scarring or atelectasis.
Otherwise, no focal consolidation.

## 2010-12-29 ENCOUNTER — Other Ambulatory Visit (HOSPITAL_COMMUNITY): Payer: Self-pay | Admitting: Internal Medicine

## 2010-12-29 DIAGNOSIS — Z1231 Encounter for screening mammogram for malignant neoplasm of breast: Secondary | ICD-10-CM

## 2011-01-30 ENCOUNTER — Ambulatory Visit (HOSPITAL_COMMUNITY)
Admission: RE | Admit: 2011-01-30 | Discharge: 2011-01-30 | Disposition: A | Discharge status: Home / Self Care | Payer: Medicare Other | Source: Ambulatory Visit | Attending: Internal Medicine | Admitting: Internal Medicine

## 2011-01-30 DIAGNOSIS — Z1231 Encounter for screening mammogram for malignant neoplasm of breast: Secondary | ICD-10-CM | POA: Insufficient documentation

## 2011-12-31 ENCOUNTER — Encounter (HOSPITAL_COMMUNITY): Payer: Self-pay | Admitting: Emergency Medicine

## 2011-12-31 ENCOUNTER — Emergency Department (HOSPITAL_COMMUNITY)
Admission: EM | Admit: 2011-12-31 | Discharge: 2011-12-31 | Disposition: A | Discharge status: Home Health | Payer: Medicare Other | Attending: Emergency Medicine | Admitting: Emergency Medicine

## 2011-12-31 DIAGNOSIS — G4700 Insomnia, unspecified: Secondary | ICD-10-CM | POA: Insufficient documentation

## 2011-12-31 DIAGNOSIS — Z79899 Other long term (current) drug therapy: Secondary | ICD-10-CM | POA: Insufficient documentation

## 2011-12-31 DIAGNOSIS — F411 Generalized anxiety disorder: Secondary | ICD-10-CM | POA: Insufficient documentation

## 2011-12-31 DIAGNOSIS — H5316 Psychophysical visual disturbances: Secondary | ICD-10-CM | POA: Insufficient documentation

## 2011-12-31 DIAGNOSIS — F319 Bipolar disorder, unspecified: Secondary | ICD-10-CM

## 2011-12-31 DIAGNOSIS — Z7982 Long term (current) use of aspirin: Secondary | ICD-10-CM | POA: Insufficient documentation

## 2011-12-31 HISTORY — DX: Bipolar disorder, unspecified: F31.9

## 2011-12-31 MED ORDER — ZIPRASIDONE HCL 80 MG PO CAPS
80.0000 mg | ORAL_CAPSULE | Freq: Once | ORAL | Status: AC
  Administered 2011-12-31: 80 mg via ORAL
  Filled 2011-12-31: qty 1

## 2011-12-31 MED ORDER — LAMOTRIGINE 100 MG PO TABS
100.0000 mg | ORAL_TABLET | Freq: Once | ORAL | Status: AC
  Administered 2011-12-31: 100 mg via ORAL
  Filled 2011-12-31: qty 1

## 2011-12-31 MED ORDER — HYDROXYZINE HCL 25 MG PO TABS
50.0000 mg | ORAL_TABLET | Freq: Once | ORAL | Status: AC
  Administered 2011-12-31: 50 mg via ORAL
  Filled 2011-12-31: qty 2

## 2011-12-31 NOTE — ED Notes (Signed)
Pt reports that she is bipolar and been out of her meds since Thursday. Pt reports that she is unable to get her medications til Thursday. Pt c/o hallucinations of hearing voices and seeing things. Pt reports that "the voices are just babbling." Pt denies Suicidal or homicidal ideation at this time.

## 2011-12-31 NOTE — ED Provider Notes (Signed)
History     CSN: [id number]  Arrival date & time 12/31/11  7829   First MD Initiated Contact with Patient 12/31/11 [phone]      Chief Complaint  Patient presents with  . Medication Refill    (Consider location/radiation/quality/duration/timing/severity/associated sxs/prior treatment) HPI Comments: Patient with history of bipolar disorder presents today with complaint of insomnia, visual/auditory hallucinations, tremors that she states is due to being out of her medications for the past 3 days. Patient is currently taking Vistaril, Lamictal, Geodon. She states that she was not able to get her medications for another 4 days due to financial reasons. Patient denies any other medical complaints currently. Patient states that she hears voices and the sound of slamming doors. Voices are nonobstructive and the patient does not have any thoughts of harming herself or others. Onset gradual. Course is constant. Nothing makes symptoms better or worse.  The history is provided by the patient.    Past Medical History  Diagnosis Date  . Bipolar disorder     Past Surgical History  Procedure Date  . Abdominal hysterectomy   . Appendectomy     No family history on file.  History  Substance Use Topics  . Smoking status: Never Smoker   . Smokeless tobacco: Not on file  . Alcohol Use: No    OB History    Grav Para Term Preterm Abortions TAB SAB Ect Mult Living                  Review of Systems  Constitutional: Negative for fever.  HENT: Negative for sore throat and rhinorrhea.   Eyes: Negative for redness.  Respiratory: Negative for cough.   Cardiovascular: Negative for chest pain.  Gastrointestinal: Negative for nausea, vomiting, abdominal pain and diarrhea.  Musculoskeletal: Negative for myalgias.  Skin: Negative for rash.  Neurological: Negative for headaches.  Psychiatric/Behavioral: Positive for hallucinations and sleep disturbance. Negative for suicidal ideas. The patient is  nervous/anxious.     Allergies  Review of patient's allergies indicates no known allergies.  Home Medications   Current Outpatient Rx  Name  Route  Sig  Dispense  Refill  . ASPIRIN EC 81 MG PO TBEC   Oral   Take 81 mg by mouth daily. Takes 2 tablets when needed for chest pain          . HYDROXYZINE PAMOATE 25 MG PO CAPS   Oral   Take 50-100 mg by mouth every evening. sleep          . LAMOTRIGINE 100 MG PO TABS   Oral   Take 100 mg by mouth 2 (two) times daily.           Marland Kitchen ZIPRASIDONE HCL 80 MG PO CAPS   Oral   Take 240 mg by mouth every evening.             BP 126/79  Pulse 75  Temp 98.3 F (36.8 C) (Oral)  Resp 16  SpO2 95%  Physical Exam  Nursing note and vitals reviewed. Constitutional: She is oriented to person, place, and time. She appears well-developed and well-nourished.  HENT:  Head: Normocephalic and atraumatic.  Eyes: Conjunctivae normal are normal. Right eye exhibits no discharge. Left eye exhibits no discharge.  Neck: Normal range of motion. Neck supple.  Cardiovascular: Normal rate, regular rhythm and normal heart sounds.   Pulmonary/Chest: Effort normal and breath sounds normal.  Abdominal: Soft. There is no tenderness.  Neurological: She is alert and oriented to  person, place, and time. GCS eye subscore is 4. GCS verbal subscore is 5. GCS motor subscore is 6.       Patient is tremulous  Skin: Skin is warm and dry.  Psychiatric: She has a normal mood and affect. She expresses no homicidal and no suicidal ideation.    ED Course  Procedures (including critical care time)  Labs Reviewed - No data to display No results found.   1. Bipolar disorder     9:26 AM Patient seen and examined.   Vital signs reviewed and are as follows: Filed Vitals:   12/31/11 0904  BP: 126/79  Pulse: 75  Temp: 98.3 F (36.8 C)  Resp: 16   9:48 AM D/w Dr. Jeraldine Loots. She has health insurance but cannot afford to get them until she gets disability check  on Wednesday (3 days). She went to her [facility] but they could not help because a doctor was there. She thinks maybe she can get help tomorrow. Because she has insurance, she cannot get meds through [facility].   Will give dose of meds here and have patient follow-up with psychiatrist tomorrow.    MDM  Bipolar, off meds x 3 days and cannot get them. Non-directive hallucinations. No SI/HI. Meds here, follow-up with psychiatrist tomorrow.         [place], [place] 12/31/11 [phone]

## 2011-12-31 NOTE — ED Provider Notes (Signed)
Medical screening examination/treatment/procedure(s) were performed by non-physician practitioner and as supervising physician I was immediately available for consultation/collaboration.  Rowe Warman, MD 12/31/11 1003 

## 2011-12-31 NOTE — ED Notes (Signed)
Pt discharged to home with family. NAD.  

## 2012-05-02 ENCOUNTER — Ambulatory Visit (HOSPITAL_COMMUNITY): Payer: Medicare Other | Admitting: Psychiatry

## 2012-06-11 ENCOUNTER — Ambulatory Visit (HOSPITAL_COMMUNITY): Payer: Medicare Other | Admitting: Psychiatry

## 2014-06-11 ENCOUNTER — Ambulatory Visit (HOSPITAL_COMMUNITY): Payer: Self-pay | Admitting: Psychiatry

## 2014-08-05 DIAGNOSIS — Z1231 Encounter for screening mammogram for malignant neoplasm of breast: Secondary | ICD-10-CM | POA: Diagnosis not present

## 2014-10-23 DIAGNOSIS — Z124 Encounter for screening for malignant neoplasm of cervix: Secondary | ICD-10-CM | POA: Diagnosis not present

## 2014-10-23 DIAGNOSIS — F319 Bipolar disorder, unspecified: Secondary | ICD-10-CM | POA: Diagnosis not present

## 2014-10-23 DIAGNOSIS — Z1389 Encounter for screening for other disorder: Secondary | ICD-10-CM | POA: Diagnosis not present

## 2014-10-23 DIAGNOSIS — Z131 Encounter for screening for diabetes mellitus: Secondary | ICD-10-CM | POA: Diagnosis not present

## 2014-10-23 DIAGNOSIS — Z13220 Encounter for screening for lipoid disorders: Secondary | ICD-10-CM | POA: Diagnosis not present

## 2014-10-23 DIAGNOSIS — Z0001 Encounter for general adult medical examination with abnormal findings: Secondary | ICD-10-CM | POA: Diagnosis not present

## 2015-02-17 ENCOUNTER — Emergency Department (HOSPITAL_COMMUNITY): Payer: Medicare Other

## 2015-02-17 ENCOUNTER — Encounter (HOSPITAL_COMMUNITY): Payer: Self-pay | Admitting: Emergency Medicine

## 2015-02-17 ENCOUNTER — Emergency Department (HOSPITAL_COMMUNITY)
Admission: EM | Admit: 2015-02-17 | Discharge: 2015-02-17 | Disposition: A | Discharge status: Home / Self Care | Payer: Medicare Other | Attending: Emergency Medicine | Admitting: Emergency Medicine

## 2015-02-17 DIAGNOSIS — Z79899 Other long term (current) drug therapy: Secondary | ICD-10-CM | POA: Diagnosis not present

## 2015-02-17 DIAGNOSIS — F319 Bipolar disorder, unspecified: Secondary | ICD-10-CM | POA: Insufficient documentation

## 2015-02-17 DIAGNOSIS — Z7982 Long term (current) use of aspirin: Secondary | ICD-10-CM | POA: Diagnosis not present

## 2015-02-17 DIAGNOSIS — R079 Chest pain, unspecified: Secondary | ICD-10-CM | POA: Diagnosis not present

## 2015-02-17 DIAGNOSIS — R202 Paresthesia of skin: Secondary | ICD-10-CM | POA: Insufficient documentation

## 2015-02-17 DIAGNOSIS — R0789 Other chest pain: Secondary | ICD-10-CM | POA: Diagnosis not present

## 2015-02-17 LAB — BASIC METABOLIC PANEL
ANION GAP: 9 (ref 5–15)
BUN: 12 mg/dL (ref 6–20)
CALCIUM: 9.3 mg/dL (ref 8.9–10.3)
CO2: 23 mmol/L (ref 22–32)
Chloride: 108 mmol/L (ref 101–111)
Creatinine, Ser: 0.86 mg/dL (ref 0.44–1.00)
GLUCOSE: 84 mg/dL (ref 65–99)
Potassium: 3.8 mmol/L (ref 3.5–5.1)
SODIUM: 140 mmol/L (ref 135–145)

## 2015-02-17 LAB — CBC
HEMATOCRIT: 38.4 % (ref 36.0–46.0)
HEMOGLOBIN: 12.4 g/dL (ref 12.0–15.0)
MCH: 27.9 pg (ref 26.0–34.0)
MCHC: 32.3 g/dL (ref 30.0–36.0)
MCV: 86.3 fL (ref 78.0–100.0)
Platelets: 264 10*3/uL (ref 150–400)
RBC: 4.45 MIL/uL (ref 3.87–5.11)
RDW: 14.4 % (ref 11.5–15.5)
WBC: 6.6 10*3/uL (ref 4.0–10.5)

## 2015-02-17 LAB — I-STAT TROPONIN, ED: Troponin i, poc: 0 ng/mL (ref 0.00–0.08)

## 2015-02-17 IMAGING — CR DG CHEST 2V
2 series · 2 of 2 positions shown · non-contrast
Comparison: [DATE]

CLINICAL DATA: Chest pain for 2 weeks. Tingling in right arm today.

EXAM:
CHEST  2 VIEW

[chest pa]
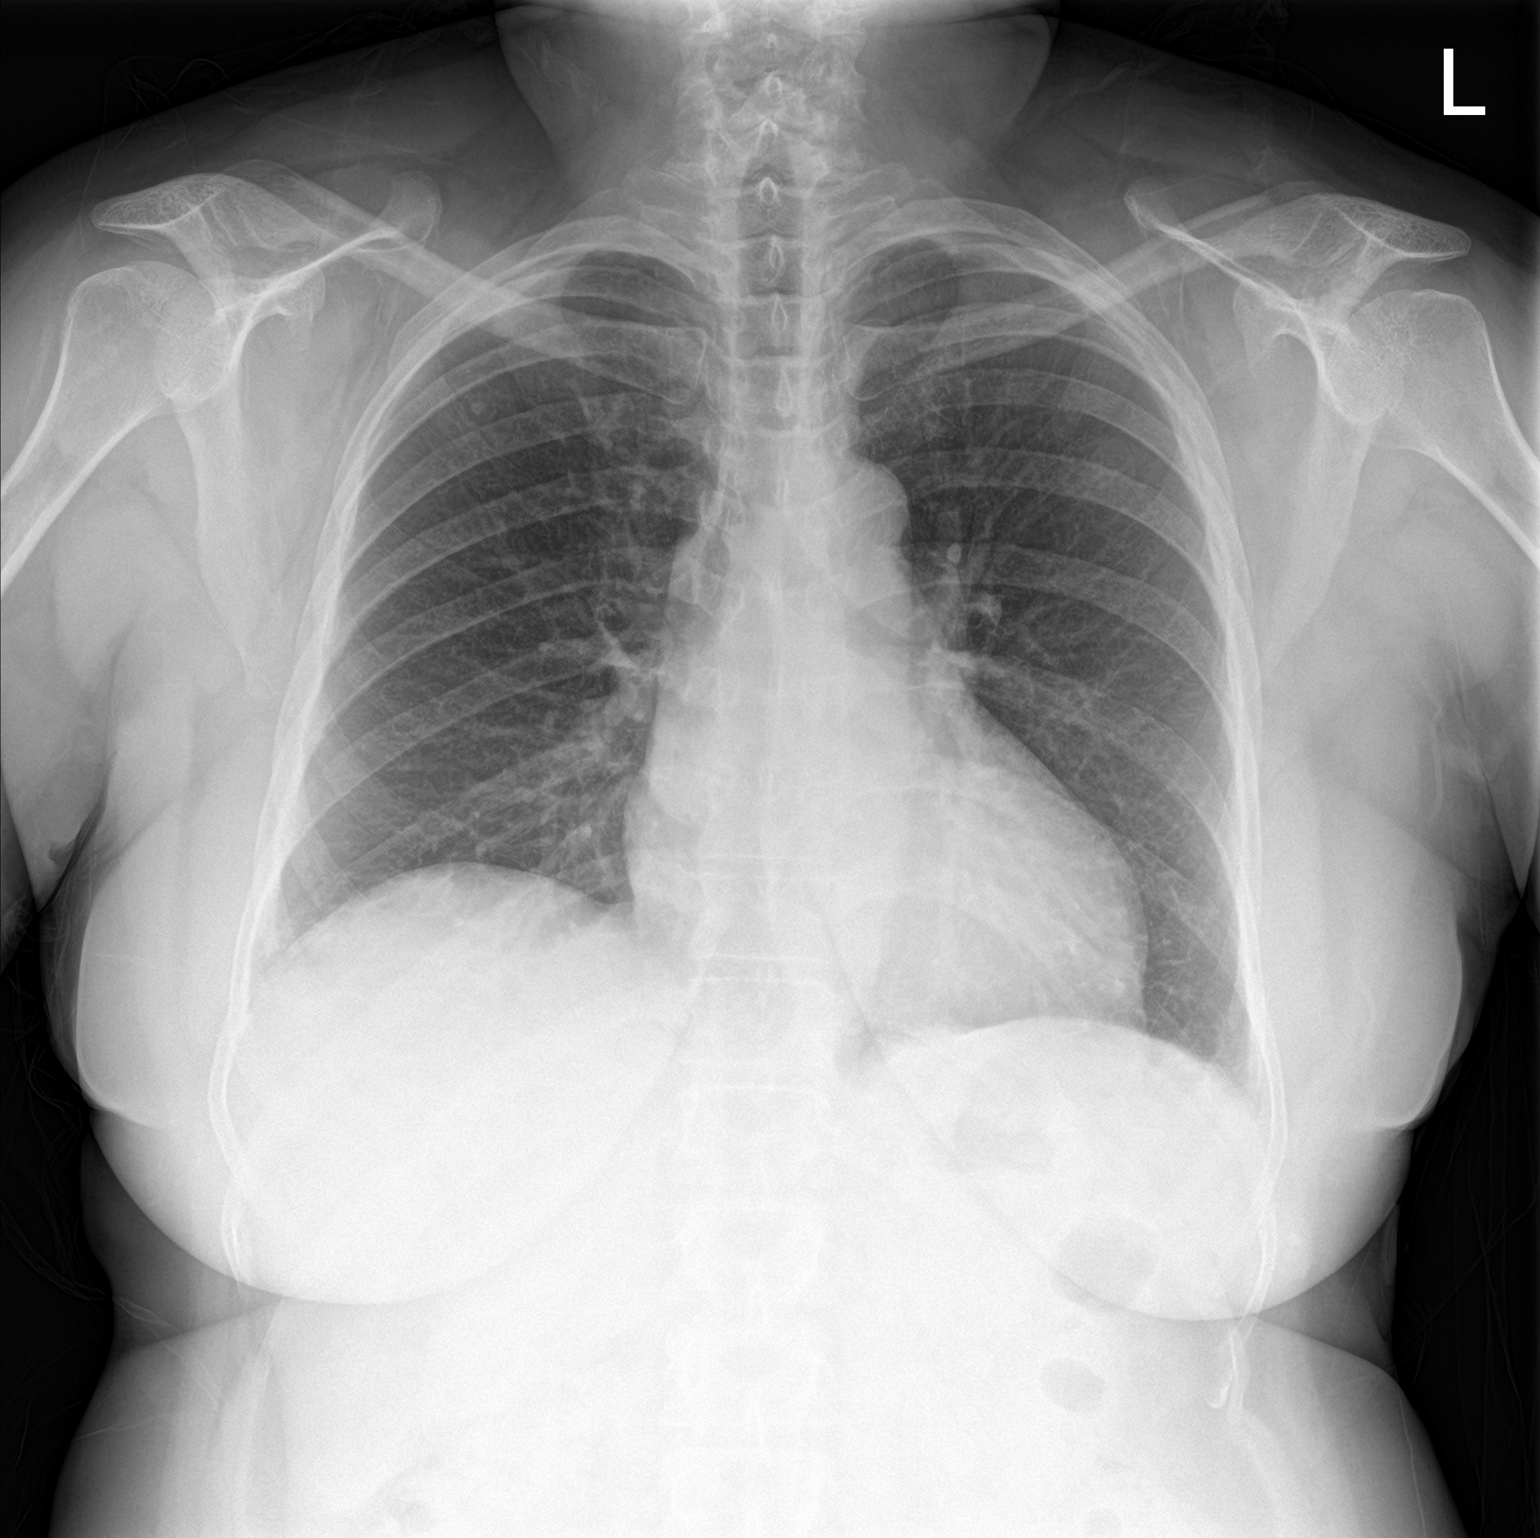

[chest lat]
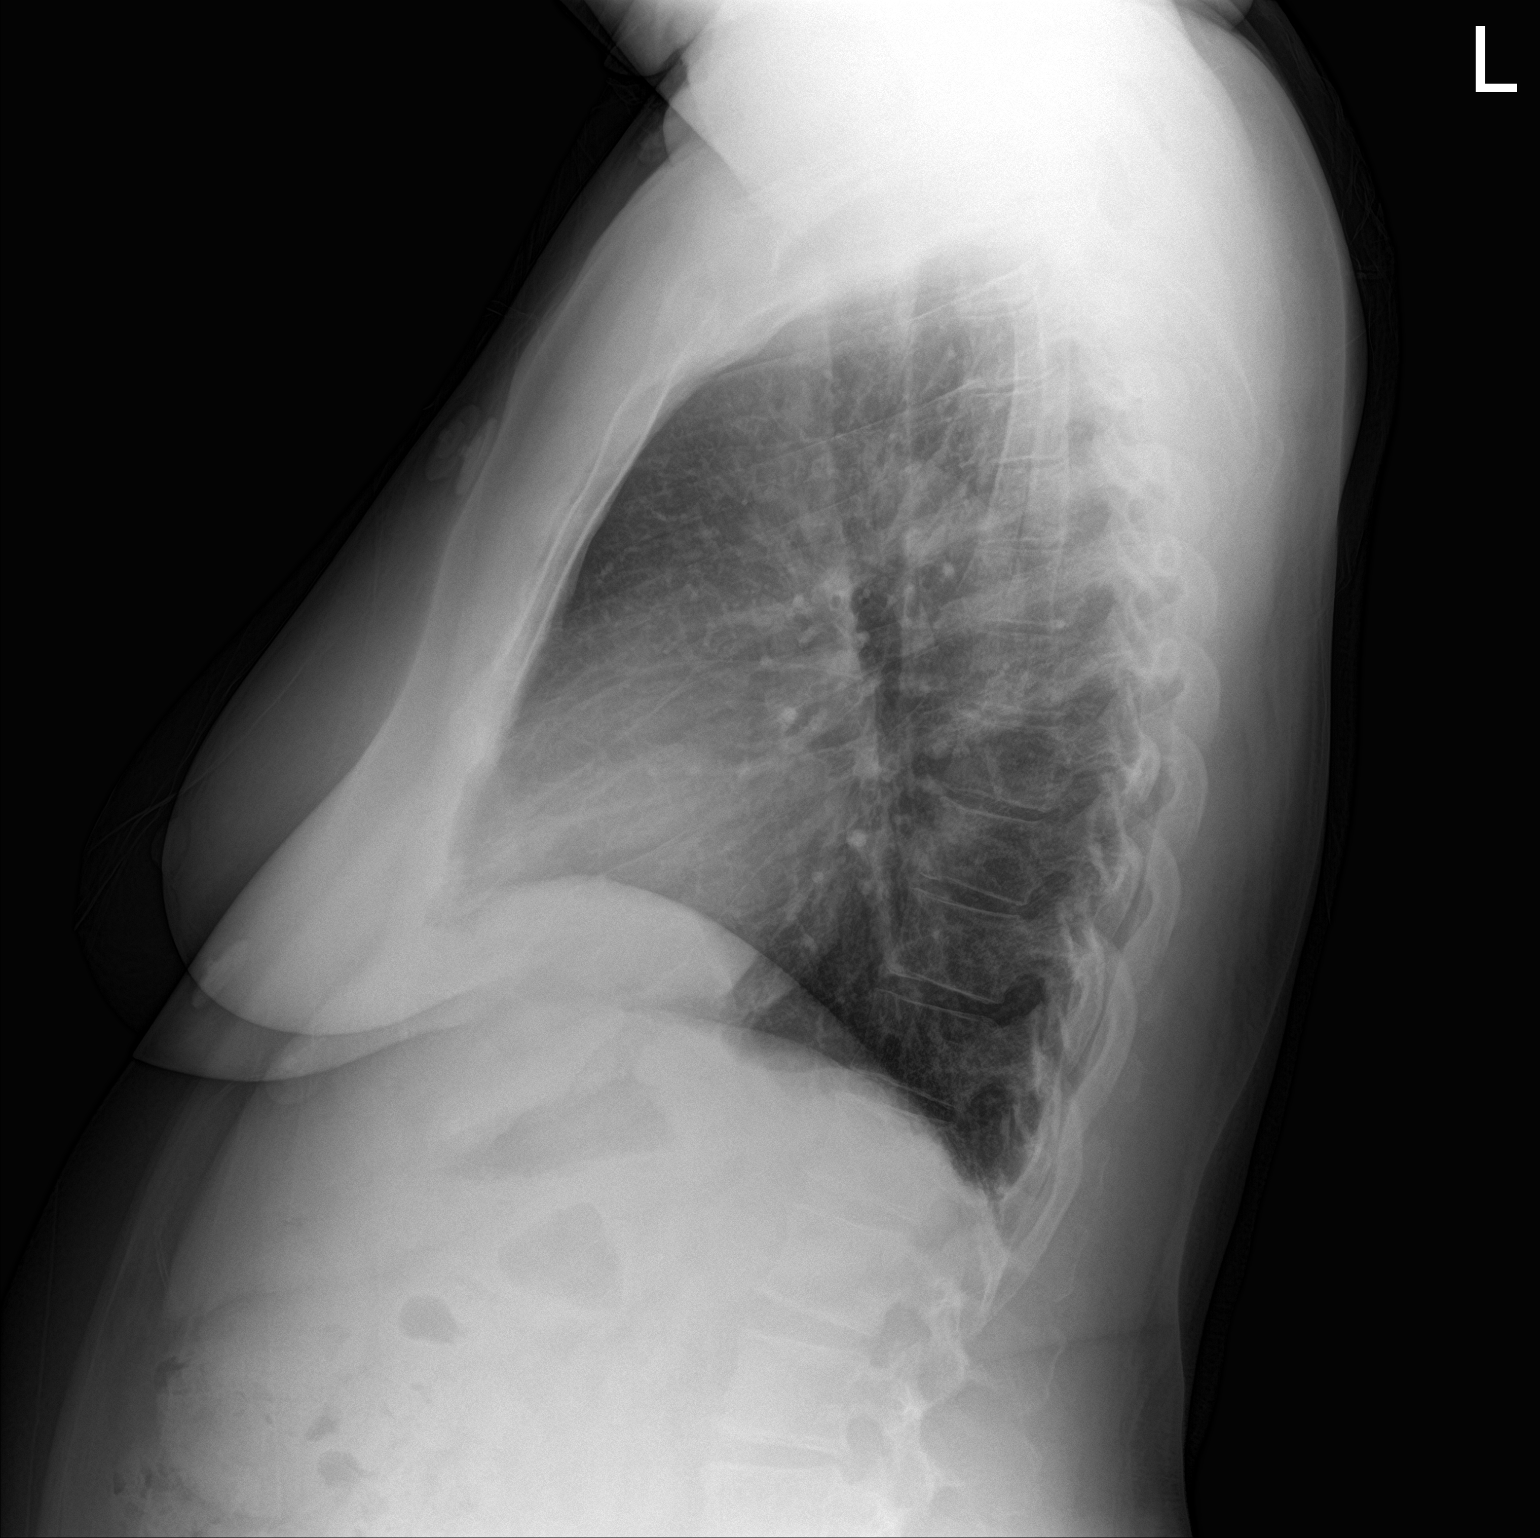

[2 of 2 positions shown; findings below may reference images not displayed]

FINDINGS: The heart size and mediastinal contours are within normal limits.
Both lungs are clear. The visualized skeletal structures are
unremarkable.
IMPRESSION: No active cardiopulmonary disease.

## 2015-02-17 MED ORDER — IBUPROFEN 400 MG PO TABS
600.0000 mg | ORAL_TABLET | Freq: Once | ORAL | Status: AC
  Administered 2015-02-17: 600 mg via ORAL
  Filled 2015-02-17: qty 1

## 2015-02-17 NOTE — Discharge Instructions (Signed)

## 2015-02-17 NOTE — ED Notes (Signed)
Pt from home for eval of cp x3 weeks that comes and goes. Pt states cp starts on left side of chest and radiates to bilateral arms, states she gets diaphoretic and nothhing makes cp better or worse. Pt states was seen a long time ago for same and work up was inconclusive. nad noted.

## 2015-02-17 NOTE — ED Provider Notes (Signed)
CSN: [id number]     Arrival date & time 02/17/15  1402 History   First MD Initiated Contact with Patient 02/17/15 1848     Chief Complaint  Patient presents with  . Chest Pain   59 year old African-American female who presents today with 3 weeks of right-sided chest pain. Described as an aching sensation worse with movement, constant, nonradiating. Has not tried anything for the pain. Occasionally had had some sweatiness with this. Today it worse her because she began to have some right arm tingling and the inside of the elbow. This lasted 15 minutes before going away. Has also noticed a mole on her chest that itches. Her chest pain has been constant and not accompanied by nausea, vomiting, fevers, chills, cough, hemoptysis, back pain, abdominal pain, sick contacts, recent travel.   (Consider location/radiation/quality/duration/timing/severity/associated sxs/prior Treatment) Patient is a 59 y.o. female presenting with chest pain.  Chest Pain Pain location:  R chest Pain quality: aching   Pain radiates to:  Does not radiate Pain radiates to the back: no   Pain severity:  Moderate Onset quality:  Gradual Duration:  3 weeks Timing:  Constant Progression:  Unchanged Chronicity:  New Relieved by:  Nothing Worsened by:  Movement Ineffective treatments:  None tried Associated symptoms: no abdominal pain, no back pain, no cough, no diaphoresis, no dizziness, no fever, no headache, no nausea, no palpitations, no shortness of breath, not vomiting and no weakness     Past Medical History  Diagnosis Date  . Bipolar disorder [facility])    Past Surgical History  Procedure Laterality Date  . Abdominal hysterectomy    . Appendectomy     No family history on file. Social History  Substance Use Topics  . Smoking status: Never Smoker   . Smokeless tobacco: None  . Alcohol Use: No   OB History    No data available     Review of Systems  Constitutional: Negative for fever, chills and  diaphoresis.  Respiratory: Negative for cough, chest tightness, shortness of breath, wheezing and stridor.   Cardiovascular: Positive for chest pain. Negative for palpitations and leg swelling.  Gastrointestinal: Negative for nausea, vomiting, abdominal pain, diarrhea, constipation and abdominal distention.  Genitourinary: Negative for dysuria, frequency, flank pain and decreased urine volume.  Musculoskeletal: Negative for back pain.       Right arm tingling  Neurological: Negative for dizziness, speech difficulty, weakness, light-headedness and headaches.  All other systems reviewed and are negative.     Allergies  Review of patient's allergies indicates no known allergies.  Home Medications   Prior to Admission medications   Medication Sig Start Date End Date Taking? Authorizing Provider  aspirin EC 81 MG tablet Take 81 mg by mouth daily. Takes 2 tablets when needed for chest pain    Yes Historical Provider, MD  Aspirin Effervescent (ALKA-SELTZER PO) Take 2 tablets by mouth daily as needed (congestion).   Yes Historical Provider, MD  clonazePAM (KLONOPIN) 0.5 MG tablet Take 0.5 mg by mouth daily. 01/27/15  Yes Historical Provider, MD  Melatonin 10 MG CAPS Take 10 mg by mouth at bedtime.   Yes Historical Provider, MD   BP 125/63 mmHg  Pulse 64  Temp(Src) 98.2 F (36.8 C) (Oral)  Resp 14  Ht  (1.651 m)  Wt 87.091 kg  BMI 31.95 kg/m2  SpO2 98% Physical Exam  Constitutional: She is oriented to person, place, and time. She appears well-developed and well-nourished. No distress.  HENT:  Head: Normocephalic and  atraumatic.  Cardiovascular: Normal rate, regular rhythm, normal heart sounds and intact distal pulses.  Exam reveals no gallop and no friction rub.   No murmur heard. Pulmonary/Chest: Effort normal and breath sounds normal. No respiratory distress. She has no wheezes. She has no rales. She exhibits tenderness (right).    Abdominal: Soft. Bowel sounds are normal. She  exhibits no distension and no mass. There is no tenderness. There is no rebound and no guarding.  Lymphadenopathy:    She has no cervical adenopathy.  Neurological: She is alert and oriented to person, place, and time. No cranial nerve deficit. Coordination normal.  Skin: Skin is warm and dry. She is not diaphoretic.  Mole on right chest, about [id number] ChurBurr MediMoroMarland KitchencErvin KnacAssociated Eye Surgical Cente89mr [phone] PBurr MediMoroMarland KitchencErvin KnacCommonwealth Health C72menAmbr([id number] East HomewoBurr MediMoroMarland KitchencErvin KnacEastside Endoscopy Center5m [id number] LongfelBurr MediMoroMarland KitchencErvin KnacEmory Healt74mhcAmbr779474 PineBurr MediMoroMarland KitchencErvin KnacUniversity Hospitals Ahuja Medical [id number] CatherineBurr MediMoroMarland KitchencErvin KnacHeywood Hos58mpiAmbr80538 WooBurr MediMoroMarland KitchencErvin KnacAnderson Regional Medical C49menAmbr64119 YuBurr MediMoroMarland KitchencErvin KnacNew Horizon Surgical Cente27mr Ambr307171 BishoBurr MediMoroMarland KitchencErvin KnacJohn R. Oishei Children'S Hos44mpiAmbr(22441 South SchoolBurr MediMoroMarland KitchencErvin KnacOchsner Lsu Health [id number] FosteBurr MediMoroMarland KitchencErvin KnacDimmit County Memorial Hos72mpiAmbr22443 GonzalBurr MediMoroMarland KitchencErvin KnacBaylor Institute For Rehabilitation At Northwest D80malAmbr85450 GreenviBurr MediMoroMarland KitchencErvin KnacAlexian Brothers Behavioral Health Hos62mpiAmbr83877 Indian SumBurr MediMoroMarland KitchencErvin KnacMount Grant General Hos38mpiAmbr225417 East High RidBurr MediMoroMarland KitchencErvin KnacMaryland Eye Surgery Cente53mr Ambr772814 EdgemBurr MediMoroMarland KitchencErvin KnacNorton Hos55mpiAmbr2032 Snake HiBurr MediMoroMarland KitchencErvin KnacSt. Anthony'S Hos97mpiAmbr64179 MiBurr MediMoroMarland KitchencErvin KnacVa N. Indiana Healthcare System - M20marAmbr2233 Oak VallBurr MediMoroMarland KitchencErvin KnacMidwest Surgery C75menAmbr70839 DunbBurr MediMoroMarland KitchencErvin KnacNorthern California Surgery Cent71merAmbr725498 InvernBurr MediMoroMarland KitchencErvin KnacPrisma Health [phone] SouthamptBurr MediMoroMarland KitchencErvin KnacRoundup Memorial Healt44mhcAmbr814981 LaurelBurr MediMoroMarland KitchencErvin KnacBrazosport Eye [phone] BuckingBurr MediMoroMarland KitchencErvin KnacJourney Lite Of Cincinnat58m531 Middle RiBurr MMarland KitcheneErvin Kna21mckAmbr97269 E. Bear HBurr MediMoroMarland KitchencErvin KnacHenry Ford Macomb [id number] SE. SherBurr MediMoroMarland KitchencErvin KnacMitchell County Memorial Hos22mpiAmbr630494 Blue SprBurr MediMoroMarland KitchencErvin KnacAtlantic Gastroenterology Endo32m9091 AugustaBurr MMarland KitcheneErvin Kna89mckAmbr([phone] South EdgewBurr MediMoroMarland KitchencErvin KnacThe Christ Hospital Health Ne24mtwAmbr613162 Glen CreBurr MediMoroMarland KitchencErvin KnacHospital San Lucas De Guayama (Cristo Rede33mntAmbr5855 RosewBurr MediMoroMarland KitchencErvin KnacCentral Hospital Of [id number] GainswBurr MediMoroMarland KitchencErvin KnacSsm Health St. Mary'S Hospital - Jefferson31m CAmbr6719 Indian SpringBurr MediMoroMarland KitchencErvin KnacNorth Shore Surgic7menAmbr9164 West ColumBurr MediMoroMarland KitchencErvin KnacInova Loudoun Hospitalling Mannsredness, or discharge.   Nursing note and vitals reviewed.   ED Course  Procedures (including critical care time) Labs Review Labs Reviewed  BASIC METABOLIC PANEL  CBC  I-STAT TROPOININ, ED    Imaging Review Dg Chest 2 View  02/17/2015  CLINICAL DATA:  Chest pain for 2 weeks. Tingling in right arm today. EXAM: CHEST  2 VIEW COMPARISON:  12/25/2010 FINDINGS: The heart size and mediastinal contours are within normal limits. Both lungs are clear. The visualized skeletal structures are unremarkable. IMPRESSION: No active cardiopulmonary disease. Electronically Signed   By: Kevin  Dover M.D.   On: 02/17/2015 14:42   I have personally reviewed and evaluated these images and lab results as part of my medical decision-making.   EKG Interpretation None      MDM   Final diagnoses:  Muscular chest pain   59 year old African-American female here with right-sided chest pain. Please see history of present illness for details. On exam patient in NAD, afebrile, vital signs stable. Chest wall is tender to touch but no signs of cellulitis, erythema, ecchymosis, induration. No consistent with infection. Not consistent with CAD/ACS, PE, pneumothorax, pneumonia. Troponin negative and workup within normal limits.  Consistent with MSK pain. Given Motrin here and encouraged Tylenol or Motrin at home. Follow-up to PCP. Also encouraged patient to follow up with dermatologist given that she has a mole in her chest. Strict return to ED with worsening symptoms especially chest pain or respiratory distress.  Pt was seen under the supervision of Dr.  Liu.     Kion Huntsberry, MD 02/17/15 2043  Dana Duo Liu, MD 02/18/15 0059

## 2015-08-08 ENCOUNTER — Encounter (HOSPITAL_COMMUNITY): Payer: Self-pay | Admitting: Emergency Medicine

## 2015-08-08 ENCOUNTER — Emergency Department (HOSPITAL_COMMUNITY)
Admission: EM | Admit: 2015-08-08 | Discharge: 2015-08-08 | Disposition: A | Discharge status: Home / Self Care | Payer: Medicare Other | Attending: Emergency Medicine | Admitting: Emergency Medicine

## 2015-08-08 DIAGNOSIS — Z7982 Long term (current) use of aspirin: Secondary | ICD-10-CM | POA: Insufficient documentation

## 2015-08-08 DIAGNOSIS — Z79899 Other long term (current) drug therapy: Secondary | ICD-10-CM | POA: Diagnosis not present

## 2015-08-08 DIAGNOSIS — H109 Unspecified conjunctivitis: Secondary | ICD-10-CM

## 2015-08-08 DIAGNOSIS — H5712 Ocular pain, left eye: Secondary | ICD-10-CM | POA: Diagnosis present

## 2015-08-08 MED ORDER — FLUORESCEIN SODIUM 1 MG OP STRP
1.0000 | ORAL_STRIP | Freq: Once | OPHTHALMIC | Status: AC
  Administered 2015-08-08: 1 via OPHTHALMIC
  Filled 2015-08-08: qty 1

## 2015-08-08 MED ORDER — TETRACAINE HCL 0.5 % OP SOLN
1.0000 [drp] | Freq: Once | OPHTHALMIC | Status: AC
  Administered 2015-08-08: 1 [drp] via OPHTHALMIC
  Filled 2015-08-08: qty 2

## 2015-08-08 MED ORDER — ERYTHROMYCIN 5 MG/GM OP OINT
1.0000 "application " | TOPICAL_OINTMENT | Freq: Once | OPHTHALMIC | Status: AC
  Administered 2015-08-08: 1 via OPHTHALMIC
  Filled 2015-08-08: qty 3.5

## 2015-08-08 NOTE — ED Provider Notes (Signed)
CSN: [id number]     Arrival date & time 08/08/15  1135 History  By signing my name below, I, Tanda Rockers, attest that this documentation has been prepared under the direction and in the presence of [employer], PA-C. Electronically Signed: Tanda Rockers, ED Scribe. 08/08/2015. 12:29 PM.   Chief Complaint  Patient presents with  . Eye Pain   The history is provided by the patient. No language interpreter was used.    HPI Comments: Brenda Nelson is a 60 y.o. female who presents to the Emergency Department complaining of gradual onset, constant, left eye redness x 5 days. Pt also complains of itchiness and discharge to the eye. No known injury to the eye. Pt has never had similar symptoms. Denies eye pain, visual changes, or any other associated symptoms. Pt is a non contact wearer.   Past Medical History  Diagnosis Date  . Bipolar disorder [facility])    Past Surgical History  Procedure Laterality Date  . Abdominal hysterectomy    . Appendectomy     History reviewed. No pertinent family history. Social History  Substance Use Topics  . Smoking status: Never Smoker   . Smokeless tobacco: None  . Alcohol Use: No   OB History    No data available     Review of Systems  All other systems reviewed and are negative.  Allergies  Review of patient's allergies indicates no known allergies.  Home Medications   Prior to Admission medications   Medication Sig Start Date End Date Taking? Authorizing Provider  aspirin EC 81 MG tablet Take 81 mg by mouth daily. Takes 2 tablets when needed for chest pain     Historical Provider, MD  Aspirin Effervescent (ALKA-SELTZER PO) Take 2 tablets by mouth daily as needed (congestion).    Historical Provider, MD  clonazePAM (KLONOPIN) 0.5 MG tablet Take 0.5 mg by mouth daily. 01/27/15   Historical Provider, MD  Melatonin 10 MG CAPS Take 10 mg by mouth at bedtime.    Historical Provider, MD   BP 133/64 mmHg  Pulse 85  Temp(Src) 98.6 F (37 C)  (Oral)  Resp 18  SpO2 99%   Physical Exam  Constitutional: She is oriented to person, place, and time. She appears well-developed and well-nourished. No distress.  HENT:  Head: Normocephalic and atraumatic.  Eyes: EOM are normal.  Bulbar conjunctival injection along the medial aspect, small amount of discharge noted. Pupils equal round and reactive to light, extraocular movements are intact with full range of motion and pain free. No hyphema or hypopyon, or pain with pupillary constriction. No signs of preseptal cellulitis.  fluorescein  exam shows no uptake  Neck: Neck supple. No tracheal deviation present.  Cardiovascular: Normal rate.   Pulmonary/Chest: Effort normal. No respiratory distress.  Musculoskeletal: Normal range of motion.  Neurological: She is alert and oriented to person, place, and time.  Skin: Skin is warm and dry.  Psychiatric: She has a normal mood and affect. Her behavior is normal.  Nursing note and vitals reviewed.   ED Course  Procedures (including critical care time)  DIAGNOSTIC STUDIES: Oxygen Saturation is 96% on RA, normal by my interpretation.    COORDINATION OF CARE: 12:26 PM-Discussed treatment plan which includes fluorescein strip test with pt at bedside and pt agreed to plan.   Labs Review Labs Reviewed - No data to display  Imaging Review No results found. I have personally reviewed and evaluated these images and lab results as part of my medical  decision-making.   EKG Interpretation None      MDM   Final diagnoses:  Conjunctivitis of left eye   Labs: None  Imaging:  Consults:  Therapeutics: Tetracaine, fluorescein   Discharge Meds:   Assessment/Plan: Patient's presentation is most consistent with bacterial conjunctivitis. Vision intact, no painful ocular movements, no signs of preseptal, septal cellulitis, iritis, or any other significant intraocular infection. Patient will be started on topical antibiotics, referred to  ophthalmology if symptoms do not improve, return to ED if they worsen. She verbalized understanding and agreement today's plan had no further questions or concerns   I personally performed the services described in this documentation, which was scribed in my presence. The recorded information has been reviewed and is accurate.    Eyvonne MechanicJeffrey French Kendra, PA-C 08/08/15 1352  Gerhard Munchobert Lockwood, MD 08/09/15 [phone]

## 2015-08-08 NOTE — ED Notes (Signed)
Pt here for eye redness and drainage x 3 days

## 2015-08-08 NOTE — Discharge Instructions (Signed)
Please use antibiotics 5 times daily for 2 days, if symptoms have not improved please follow-up with ophthalmology, return to the emergency room immediately if any new or worsening signs or sym Bacterial Conjunctivitis Bacterial conjunctivitis (commonly called pink eye) is redness, soreness, or puffiness (inflammation) of the white part of your eye. It is caused by a germ called bacteria. These germs can easily spread from person to person (contagious). Your eye often will become red or pink. Your eye may also become irritated, watery, or have a thick discharge.  HOME CARE   Apply a cool, clean washcloth over closed eyelids. Do this for 10-20 minutes, 3-4 times a day while you have pain.  Gently wipe away any fluid coming from the eye with a warm, wet washcloth or cotton ball.  Wash your hands often with soap and water. Use paper towels to dry your hands.  Do not share towels or washcloths.  Change or wash your pillowcase every day.  Do not use eye makeup until the infection is gone.  Do not use machines or drive if your vision is blurry.  Stop using contact lenses. Do not use them again until your doctor says it is okay.  Do not touch the tip of the eye drop bottle or medicine tube with your fingers when you put medicine on the eye. GET HELP RIGHT AWAY IF:   Your eye is not better after 3 days of starting your medicine.  You have a yellowish fluid coming out of the eye.  You have more pain in the eye.  Your eye redness is spreading.  Your vision becomes blurry.  You have a fever or lasting symptoms for more than 2-3 days.  You have a fever and your symptoms suddenly get worse.  You have pain in the face.  Your face gets red or puffy (swollen). MAKE SURE YOU:   Understand these instructions.  Will watch this condition.  Will get help right away if you are not doing well or get worse.   This information is not intended to replace advice given to you by your health care  provider. Make sure you discuss any questions you have with your health care provider.   Document Released: 11/16/2007 Document Revised: 01/24/2012 Document Reviewed: 10/13/2011 Elsevier Interactive Patient Education [employer]2016 Elsevier Inc. ptoms present.

## 2015-08-08 NOTE — ED Notes (Signed)
Declined W/C at D/C and was escorted to lobby by RN. 

## 2015-11-27 ENCOUNTER — Emergency Department (HOSPITAL_COMMUNITY)
Admission: EM | Admit: 2015-11-27 | Discharge: 2015-11-27 | Disposition: A | Discharge status: Home / Self Care | Payer: Medicare Other | Attending: Emergency Medicine | Admitting: Emergency Medicine

## 2015-11-27 ENCOUNTER — Emergency Department (HOSPITAL_COMMUNITY): Payer: Medicare Other

## 2015-11-27 ENCOUNTER — Encounter (HOSPITAL_COMMUNITY): Payer: Self-pay | Admitting: Emergency Medicine

## 2015-11-27 DIAGNOSIS — Z7982 Long term (current) use of aspirin: Secondary | ICD-10-CM | POA: Diagnosis not present

## 2015-11-27 DIAGNOSIS — R05 Cough: Secondary | ICD-10-CM

## 2015-11-27 DIAGNOSIS — J40 Bronchitis, not specified as acute or chronic: Secondary | ICD-10-CM

## 2015-11-27 DIAGNOSIS — R058 Other specified cough: Secondary | ICD-10-CM

## 2015-11-27 IMAGING — DX DG CHEST 2V
2 series · 2 of 2 positions shown · non-contrast
Comparison: [DATE]

CLINICAL DATA: Worsening productive cough.

EXAM:
CHEST  2 VIEW

[w chest pa]
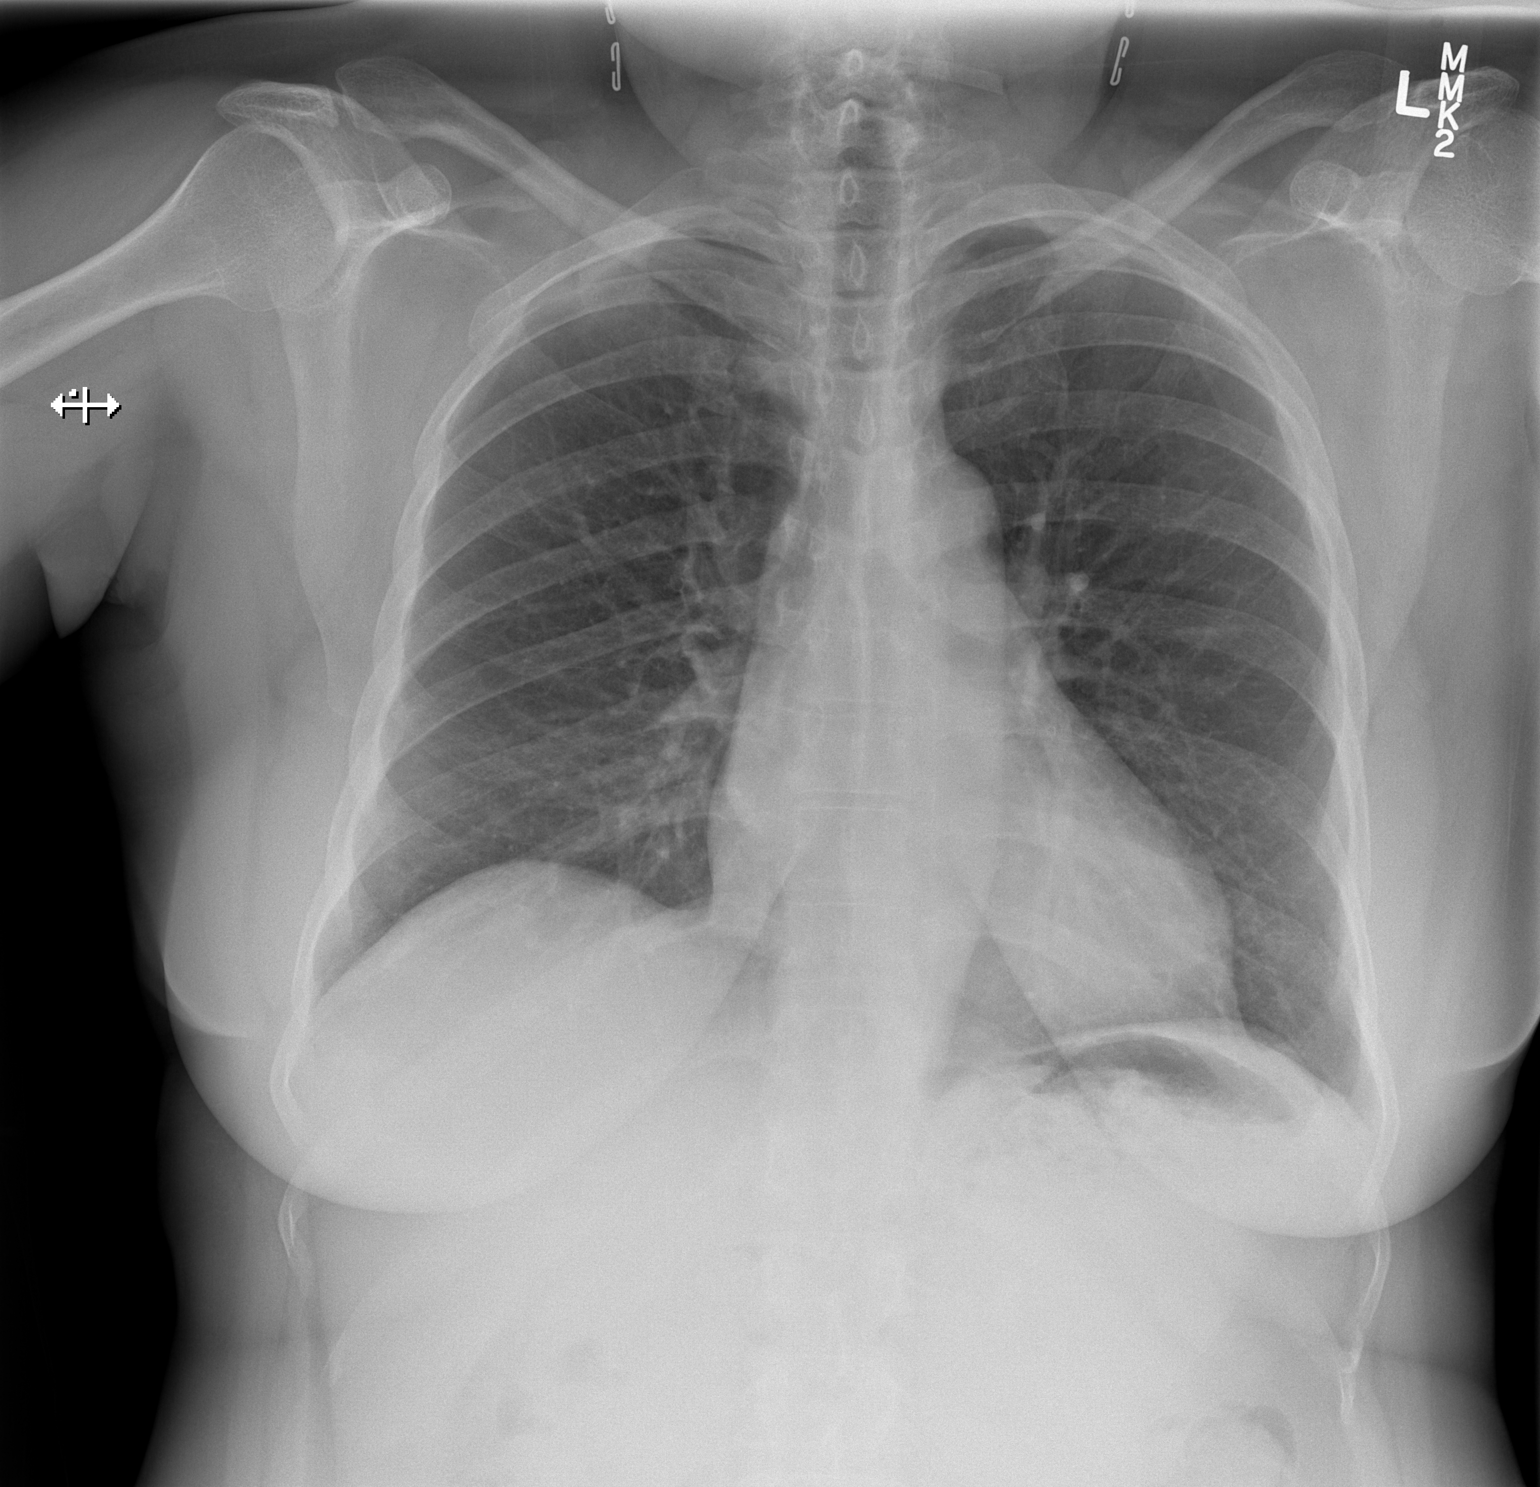

[w chest lat]
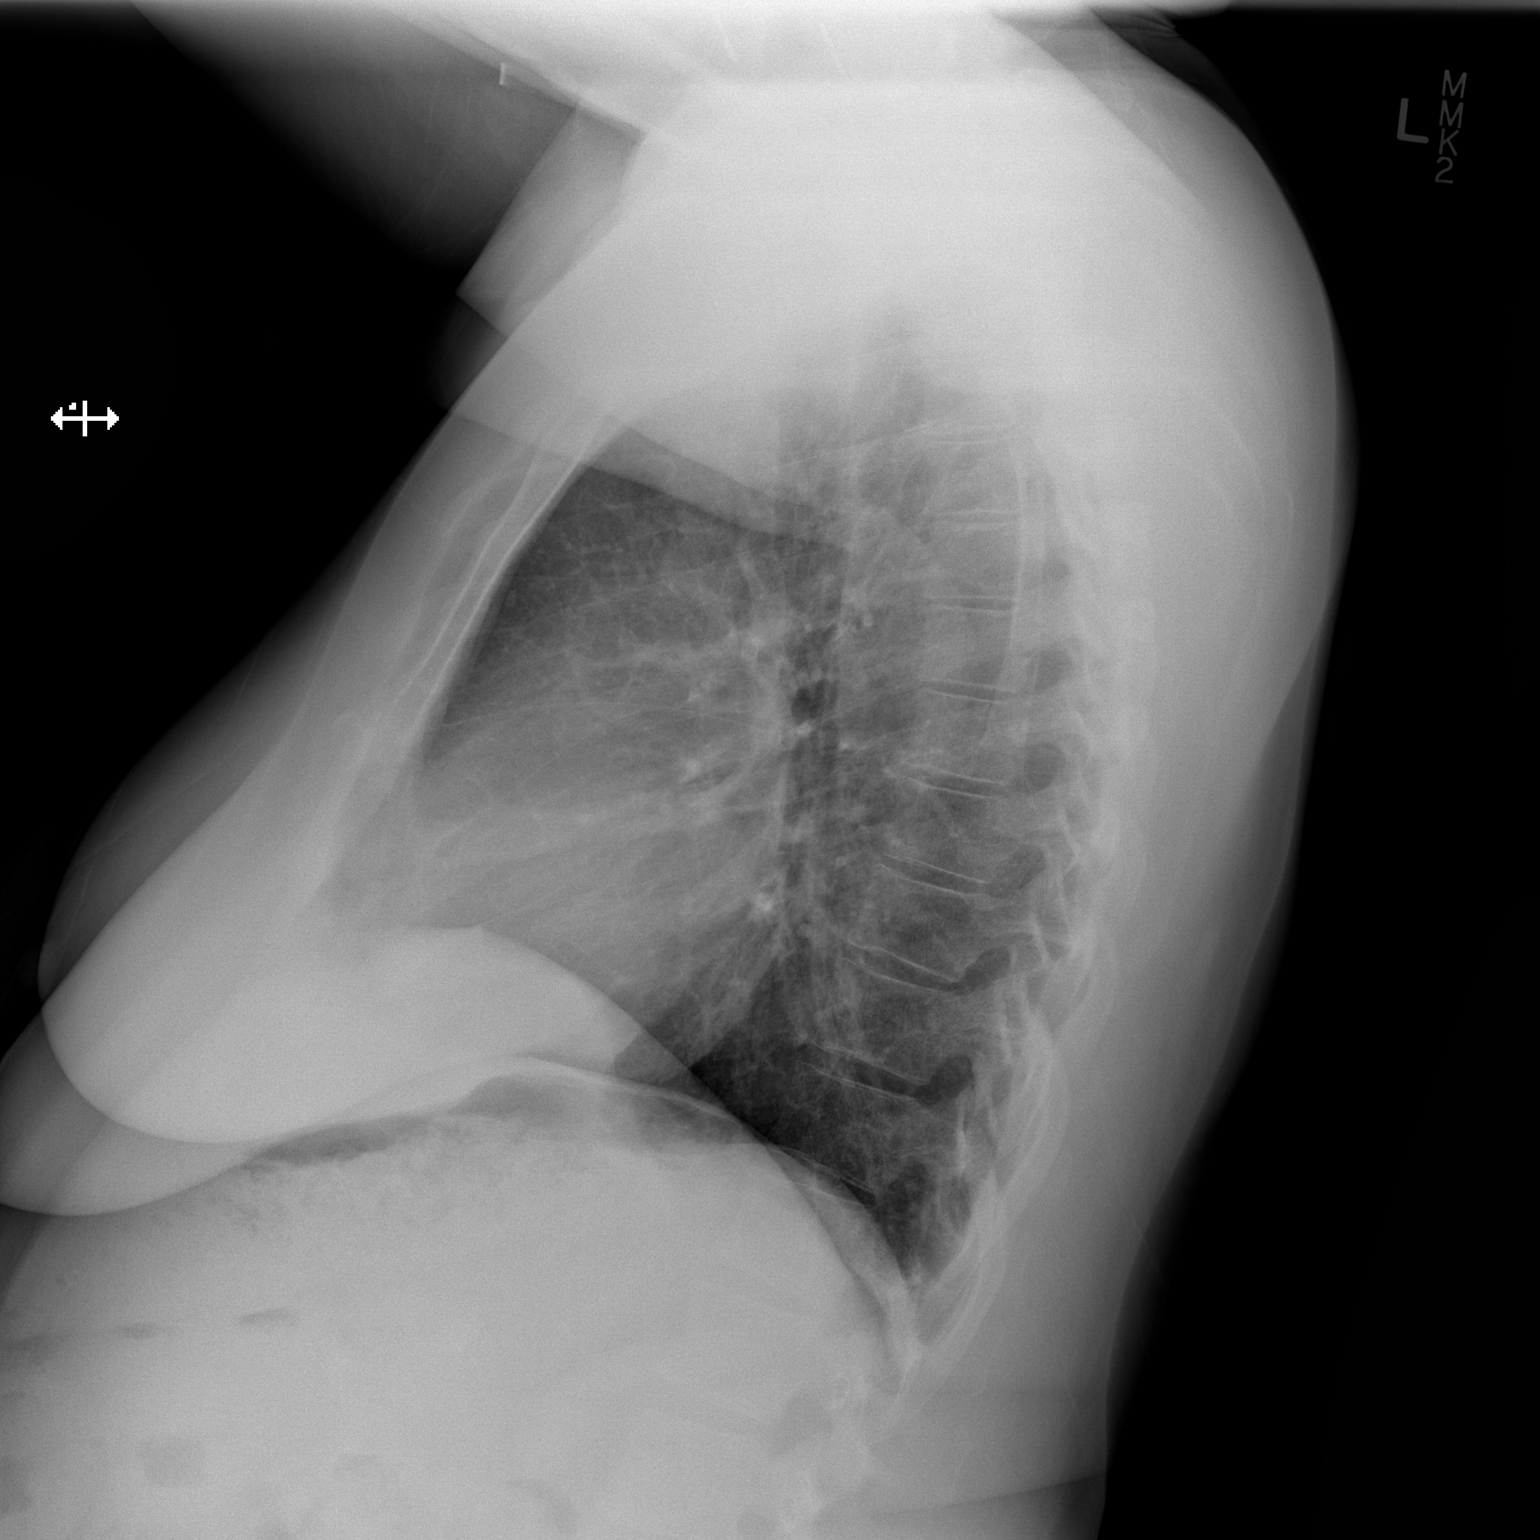

[2 of 2 positions shown; findings below may reference images not displayed]

FINDINGS: The heart size and mediastinal contours are within normal limits.
Both lungs are clear. The visualized skeletal structures are
unremarkable.
IMPRESSION: No active cardiopulmonary disease.

## 2015-11-27 MED ORDER — AZITHROMYCIN 250 MG PO TABS: 250.0000 mg | ORAL_TABLET | Freq: Every day | ORAL | 0 refills | Status: DC

## 2015-11-27 MED ORDER — GUAIFENESIN-CODEINE 100-10 MG/5ML PO SOLN: 5.0000 mL | Freq: Three times a day (TID) | ORAL | 0 refills | Status: DC | PRN

## 2015-11-27 MED ORDER — GUAIFENESIN-CODEINE 100-10 MG/5ML PO SOLN
5.0000 mL | Freq: Once | ORAL | Status: AC
  Administered 2015-11-27: 5 mL via ORAL
  Filled 2015-11-27: qty 5

## 2015-11-27 NOTE — ED Provider Notes (Signed)
[facility]-EMERGENCY DEPT Provider Note   CSN: [id number] Arrival date & time: 11/27/15  2017  By signing my name below, I, Nelwyn SalisburyJoshua Fowler, attest that this documentation has been prepared under the direction and in the presence of non-physician practitioner, Elizabeth SauerJaime Ward, PA-C. Electronically Signed: Nelwyn SalisburyJoshua Fowler, Scribe. 11/27/2015. 8:45 PM.  History   Chief Complaint Chief Complaint  Patient presents with  . Nasal Congestion   The history is provided by the patient. No language interpreter was used.   HPI Comments:  Brenda Nelson is an otherwise healthy 60 y.o. female who presents to the Emergency Department complaining of gradually worsening constant productive cough beginning about 8 days ago. She notes that her coughing tends to worsen when she is speaking. Pt reports that's she has tried Nyquil and night-time Alka-Seltzer cold and flu with minimal relief. She endorses associated chills, resolved wheezes, and sneezing. Pt denies any sore throat, chest pain, shortness of breath or fever.   Past Medical History:  Diagnosis Date  . Bipolar disorder (HCC)     There are no active problems to display for this patient.   Past Surgical History:  Procedure Laterality Date  . ABDOMINAL HYSTERECTOMY    . APPENDECTOMY      OB History    No data available      Home Medications    Prior to Admission medications   Medication Sig Start Date End Date Taking? Authorizing Provider  aspirin EC 81 MG tablet Take 81 mg by mouth daily. Takes 2 tablets when needed for chest pain     Historical Provider, MD  Aspirin Effervescent (ALKA-SELTZER PO) Take 2 tablets by mouth daily as needed (congestion).    Historical Provider, MD  azithromycin (ZITHROMAX) 250 MG tablet Take 1 tablet (250 mg total) by mouth daily. Take first 2 tablets together, then 1 every day until finished. 11/27/15   Jaime Pilcher Ward, PA-C  clonazePAM (KLONOPIN) 0.5 MG tablet Take 0.5 mg by mouth daily. 01/27/15   Historical  Provider, MD  guaiFENesin-codeine 100-10 MG/5ML syrup Take 5 mLs by mouth 3 (three) times daily as needed for cough. 11/27/15   Jaime Pilcher Ward, PA-C  Melatonin 10 MG CAPS Take 10 mg by mouth at bedtime.    Historical Provider, MD    Family History No family history on file.  Social History Social History  Substance Use Topics  . Smoking status: Never Smoker  . Smokeless tobacco: Never Used  . Alcohol use No     Allergies   Review of patient's allergies indicates no known allergies.   Review of Systems Review of Systems  Constitutional: Positive for chills. Negative for fever.  HENT: Negative for congestion and sore throat.   Respiratory: Positive for cough, shortness of breath and wheezing (Resolved).   Cardiovascular: Positive for chest pain.     Physical Exam Updated Vital Signs BP 127/61 (BP Location: Left Arm)   Pulse 75   Temp 98.6 F (37 C) (Oral)   Resp 18   Ht 5\' 2"  (1.575 m)   Wt 86.2 kg   SpO2 99%   BMI 34.75 kg/m   Physical Exam  Constitutional: She is oriented to person, place, and time. She appears well-developed and well-nourished. No distress.  HENT:  Head: Normocephalic and atraumatic.  No focal sinus tenderness. Oropharynx clear and moist.   Cardiovascular: Normal rate, regular rhythm and normal heart sounds.   No murmur heard. Pulmonary/Chest: Effort normal and breath sounds normal. No respiratory distress. She has no  wheezes. She has no rales. She exhibits no tenderness.  Coughing fits with deep breathing.   Abdominal: Soft. She exhibits no distension. There is no tenderness.  Neurological: She is alert and oriented to person, place, and time.  Skin: Skin is warm and dry.  Psychiatric: She has a normal mood and affect.  Nursing note and vitals reviewed.    ED Treatments / Results  DIAGNOSTIC STUDIES:  Oxygen Saturation is 99% on RA, normal by my interpretation.    COORDINATION OF CARE:  8:55 PM Discussed treatment plan with pt  at bedside which includes cough medicine and imaging and pt agreed to plan.  Labs (all labs ordered are listed, but only abnormal results are displayed) Labs Reviewed - No data to display  EKG  EKG Interpretation None       Radiology Dg Chest 2 View  Result Date: 11/27/2015 CLINICAL DATA:  Worsening productive cough. EXAM: CHEST  2 VIEW COMPARISON:  February 17, 2015 FINDINGS: The heart size and mediastinal contours are within normal limits. Both lungs are clear. The visualized skeletal structures are unremarkable. IMPRESSION: No active cardiopulmonary disease. Electronically Signed   By: Gerome Sam III M.D   On: 11/27/2015 21:29    Procedures Procedures (including critical care time)  Medications Ordered in ED Medications  guaiFENesin-codeine 100-10 MG/5ML solution 5 mL (5 mLs Oral Given 11/27/15 2059)     Initial Impression / Assessment and Plan / ED Course  I have reviewed the triage vital signs and the nursing notes.  Pertinent labs & imaging results that were available during my care of the patient were reviewed by me and considered in my medical decision making (see chart for details).  Clinical Course   Brenda Nelson is a 60 y.o. female who presents to ED for sxs consistent with bronchitis. CXR negative. PCP follow up encouraged. Reasons to return to ED discussed. All questions answered.   Blood pressure 127/61, pulse 75, temperature 98.6 F (37 C), temperature source Oral, resp. rate 18, height 5\' 2"  (1.575 m), weight 86.2 kg, SpO2 99 %.  Final Clinical Impressions(s) / ED Diagnoses   Final diagnoses:  Productive cough  Bronchitis    New Prescriptions New Prescriptions   AZITHROMYCIN (ZITHROMAX) 250 MG TABLET    Take 1 tablet (250 mg total) by mouth daily. Take first 2 tablets together, then 1 every day until finished.   GUAIFENESIN-CODEINE 100-10 MG/5ML SYRUP    Take 5 mLs by mouth 3 (three) times daily as needed for cough.   I personally  performed the services described in this documentation, which was scribed in my presence. The recorded information has been reviewed and is accurate.    [facility] Ward, PA-C 11/27/15 2332    Benjiman Core, MD 11/28/15 1524

## 2015-11-27 NOTE — ED Notes (Signed)
PA-C to see and assess pt before RN assessment. See PA note.

## 2015-11-27 NOTE — ED Notes (Signed)
Patient verbalized understanding of discharge instructions and denies any further needs or questions at this time. VS stable. Patient ambulatory with steady gait, pt declined wheelchair. Escorted to ED entrance.

## 2015-11-27 NOTE — ED Triage Notes (Signed)
Pt reports congestion, coughing, sweating and sneezing x8 days, concerned she has the flu. Denies fevers, states she hasn't been checking them. States so sweaty she has to change her clothes. No sick contacts. Hx bipolar.

## 2015-11-27 NOTE — Discharge Instructions (Signed)
Please take all of your antibiotics until finished!  Take cough syrup as needed - This can make you very drowsy - please do not drink alcohol, operate heavy machinery or drive on this medication. If you are not feeling better by Wednesday or you are getting worse instead of better, please follow up with your primary care provider.  Return to ER for new or worsening symptoms, any additional concerns.

## 2016-03-20 ENCOUNTER — Other Ambulatory Visit: Payer: Self-pay | Admitting: Internal Medicine

## 2016-03-20 DIAGNOSIS — E2839 Other primary ovarian failure: Secondary | ICD-10-CM

## 2016-03-27 ENCOUNTER — Inpatient Hospital Stay
Admission: RE | Admit: 2016-03-27 | Discharge: 2016-03-27 | Disposition: A | Discharge status: Home / Self Care | Payer: Self-pay | Source: Ambulatory Visit | Attending: Internal Medicine | Admitting: Internal Medicine

## 2016-06-19 ENCOUNTER — Other Ambulatory Visit: Payer: Self-pay | Admitting: Internal Medicine

## 2016-06-19 DIAGNOSIS — Z1231 Encounter for screening mammogram for malignant neoplasm of breast: Secondary | ICD-10-CM

## 2016-06-23 ENCOUNTER — Other Ambulatory Visit: Payer: Self-pay

## 2016-07-07 ENCOUNTER — Ambulatory Visit
Admission: RE | Admit: 2016-07-07 | Discharge: 2016-07-07 | Disposition: A | Discharge status: Home / Self Care | Payer: Medicare Other | Source: Ambulatory Visit | Attending: Internal Medicine | Admitting: Internal Medicine

## 2016-07-07 DIAGNOSIS — Z1231 Encounter for screening mammogram for malignant neoplasm of breast: Secondary | ICD-10-CM

## 2016-07-07 DIAGNOSIS — E2839 Other primary ovarian failure: Secondary | ICD-10-CM

## 2018-03-01 ENCOUNTER — Other Ambulatory Visit: Payer: Self-pay | Admitting: Orthopedic Surgery

## 2019-03-20 ENCOUNTER — Ambulatory Visit: Payer: Self-pay

## 2019-03-27 ENCOUNTER — Ambulatory Visit: Payer: Medicare Other | Attending: Internal Medicine

## 2019-03-27 DIAGNOSIS — Z23 Encounter for immunization: Secondary | ICD-10-CM | POA: Insufficient documentation

## 2019-03-27 NOTE — Progress Notes (Signed)
   Covid-19 Vaccination Clinic  Name:  Brenda Nelson    MRN: [id number] DOB: [birth year]  03/27/2019  [name] was observed post Covid-19 immunization for 15 minutes without incidence. She was provided with Vaccine Information Sheet and instruction to access the V-Safe system.   [name] was instructed to call 911 with any severe reactions post vaccine: Marland Kitchen Difficulty breathing  . Swelling of your face and throat  . A fast heartbeat  . A bad rash all over your body  . Dizziness and weakness    Immunizations Administered    Name Date Dose VIS Date Route   Pfizer COVID-19 Vaccine 03/27/2019  8:35 AM 0.3 mL 01/31/2019 Intramuscular   Manufacturer: [employer], [employer]   Lot: [id number]   NDC: [address]-7

## 2019-04-21 ENCOUNTER — Ambulatory Visit: Payer: Medicare Other | Attending: Internal Medicine

## 2019-04-21 DIAGNOSIS — Z23 Encounter for immunization: Secondary | ICD-10-CM | POA: Insufficient documentation

## 2019-04-21 NOTE — Progress Notes (Signed)
   Covid-19 Vaccination Clinic  Name:  Brenda Nelson    MRN: [id number] DOB: August 04, 1955  04/21/2019  [name] was observed post Covid-19 immunization for 15 minutes without incidence. She was provided with Vaccine Information Sheet and instruction to access the V-Safe system.   [name] was instructed to call 911 with any severe reactions post vaccine: Marland Kitchen Difficulty breathing  . Swelling of your face and throat  . A fast heartbeat  . A bad rash all over your body  . Dizziness and weakness    Immunizations Administered    Name Date Dose VIS Date Route   Pfizer COVID-19 Vaccine 04/21/2019  1:00 PM 0.3 mL 01/31/2019 Intramuscular   Manufacturer: [employer], [employer]   Lot: [id number]   NDC: [address]-0

## 2019-04-30 ENCOUNTER — Other Ambulatory Visit: Payer: Self-pay | Admitting: Internal Medicine

## 2019-04-30 DIAGNOSIS — E2839 Other primary ovarian failure: Secondary | ICD-10-CM

## 2019-07-16 ENCOUNTER — Other Ambulatory Visit: Payer: Medicare Other

## 2020-07-20 ENCOUNTER — Other Ambulatory Visit: Payer: Self-pay | Admitting: Internal Medicine

## 2020-07-20 DIAGNOSIS — E2839 Other primary ovarian failure: Secondary | ICD-10-CM

## 2021-05-04 ENCOUNTER — Emergency Department (HOSPITAL_COMMUNITY)
Admission: EM | Admit: 2021-05-04 | Discharge: 2021-05-05 | Disposition: A | Discharge status: Home / Self Care | Payer: Medicare Other | Attending: Emergency Medicine | Admitting: Emergency Medicine

## 2021-05-04 ENCOUNTER — Emergency Department (HOSPITAL_COMMUNITY): Payer: Medicare Other

## 2021-05-04 ENCOUNTER — Other Ambulatory Visit: Payer: Self-pay

## 2021-05-04 DIAGNOSIS — M546 Pain in thoracic spine: Secondary | ICD-10-CM | POA: Insufficient documentation

## 2021-05-04 IMAGING — CT CT CERVICAL SPINE W/O CM
3 of 4 series · 12 of 35 positions shown, 14 images · non-contrast
Comparison: None.

CLINICAL DATA: Cervical radiculopathy.



[Series 5: c_spine 2.0 (person_name) (person_name) · axial · 0.23mm/px · z∈[-238,-136]mm · 4 of 77 slices shown, 5 images]
[im 13/77  soft-tissue]
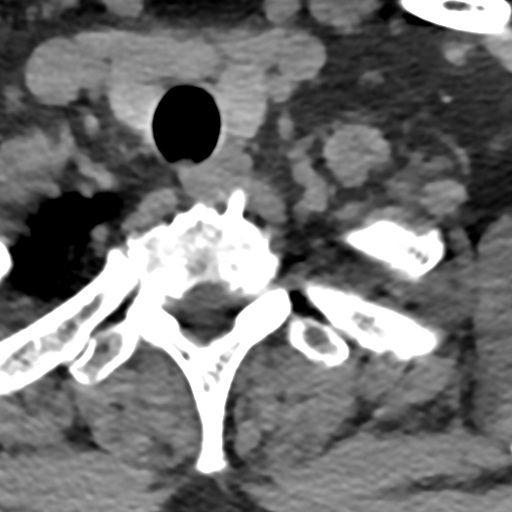
[im 13/77  bone]
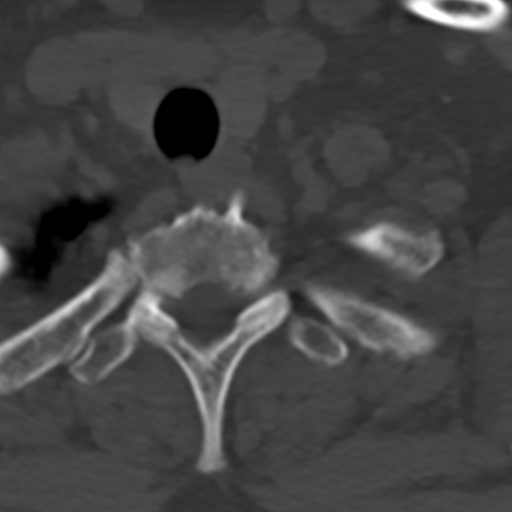
[im 26/77  bone]
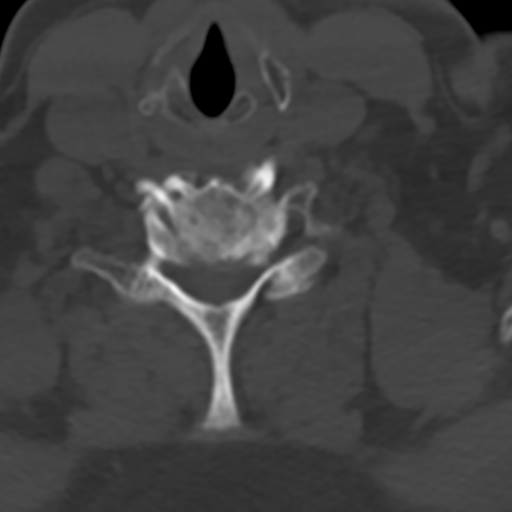
[im 51/77  bone]
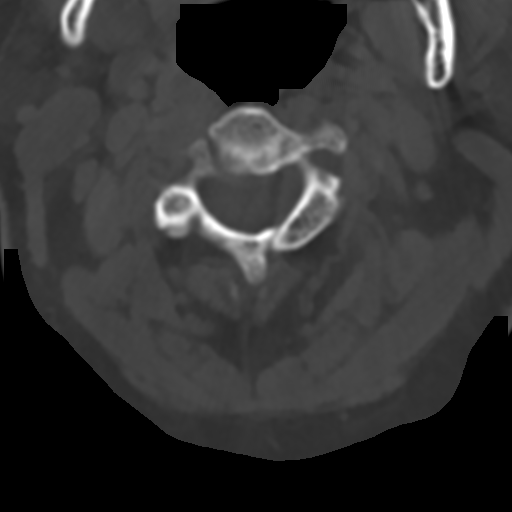
[im 64/77  bone]
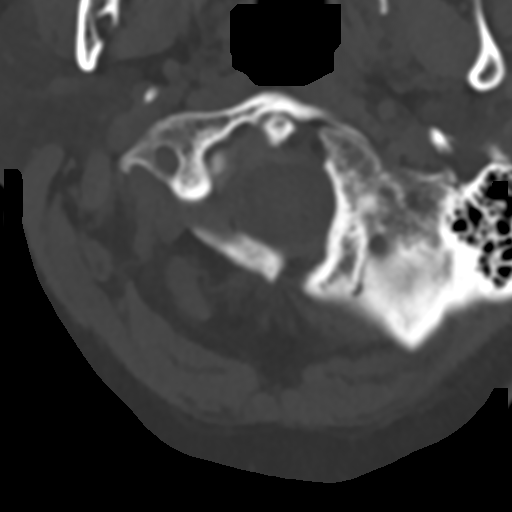

[Series 8: coronal bone · coronal · 0.19mm/px · 3 of 53 slices shown]
[im 11/53  bone]
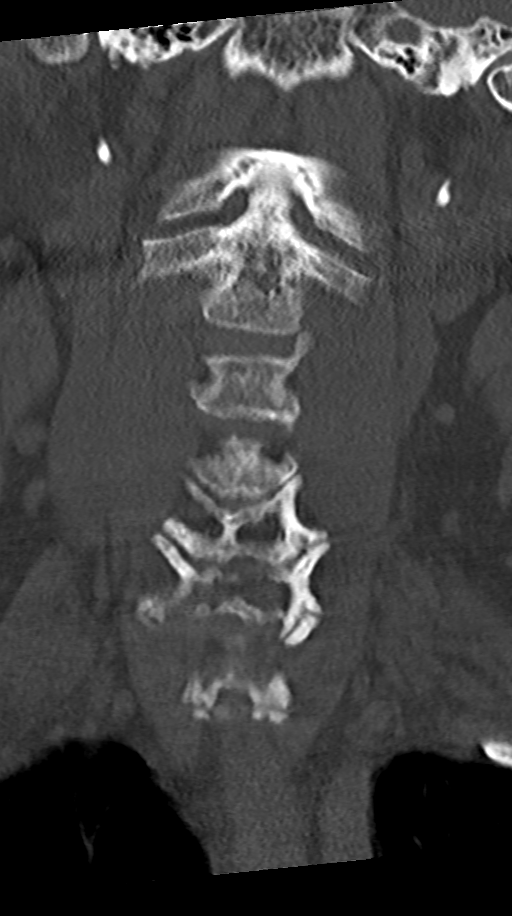
[im 21/53  bone]
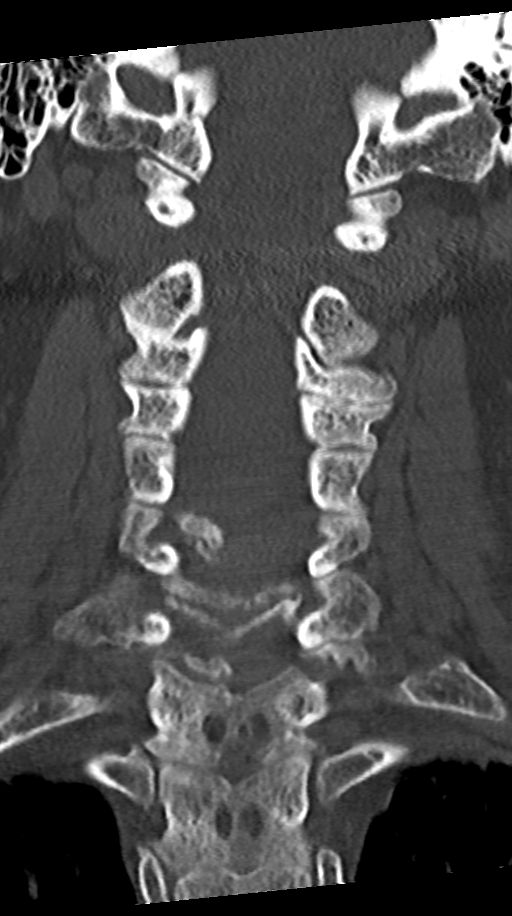
[im 32/53  bone]
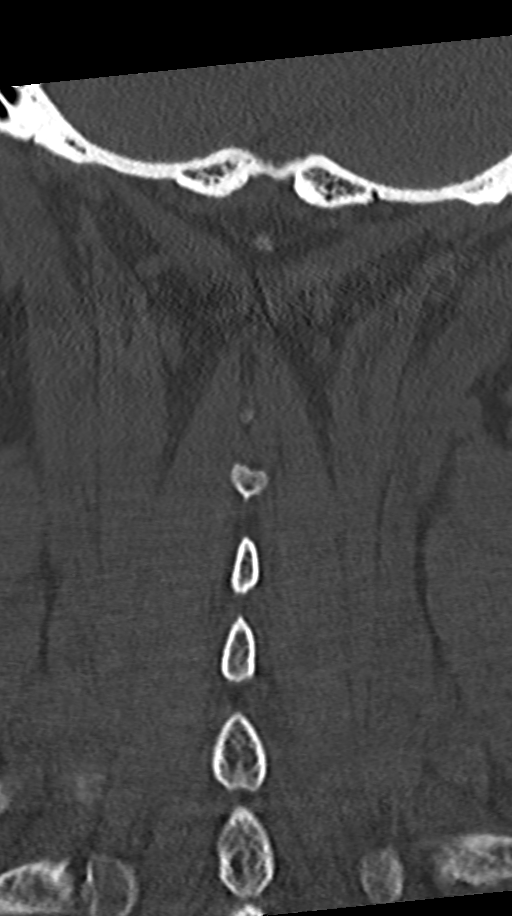

[Series 9: sagittal bone · sagittal · 0.21mm/px · 5 of 48 slices shown, 6 images]
[im 16/48  bone]
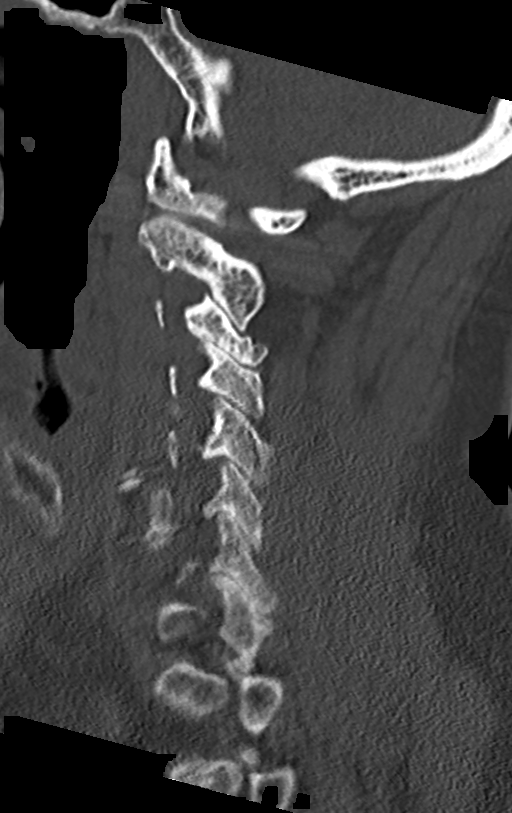
[im 20/48  bone]
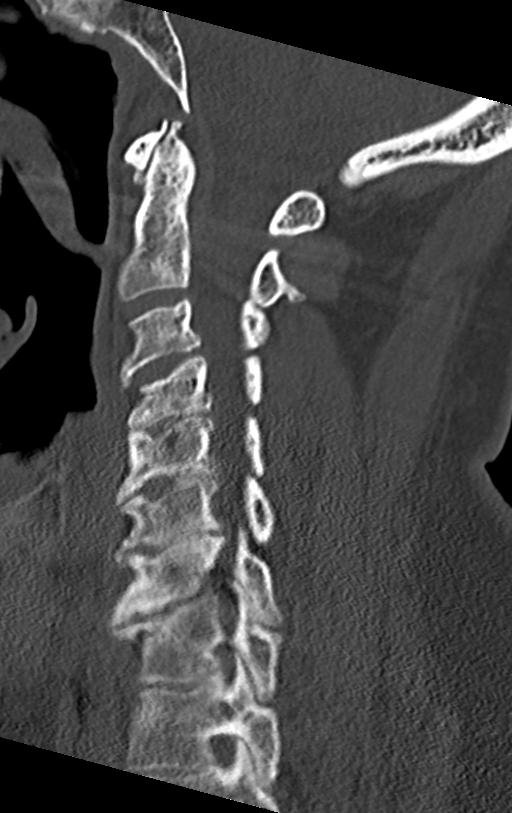
[im 24/48  soft-tissue]
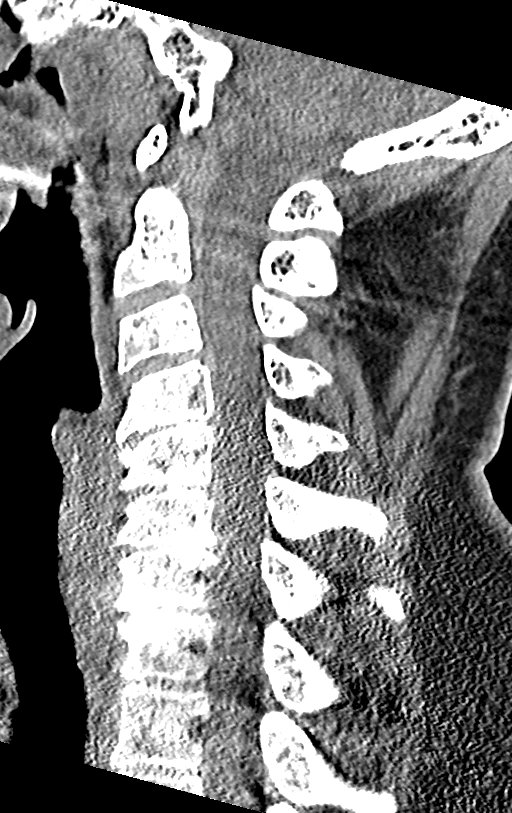
[im 24/48  bone]
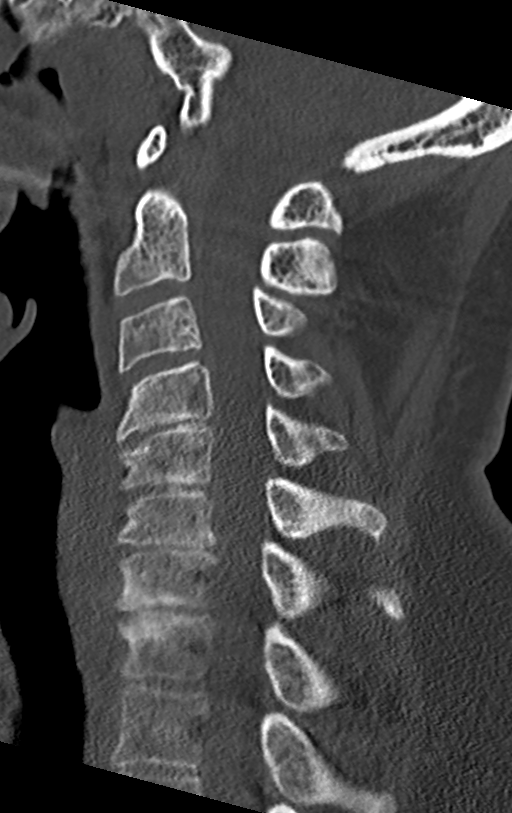
[im 28/48  bone]
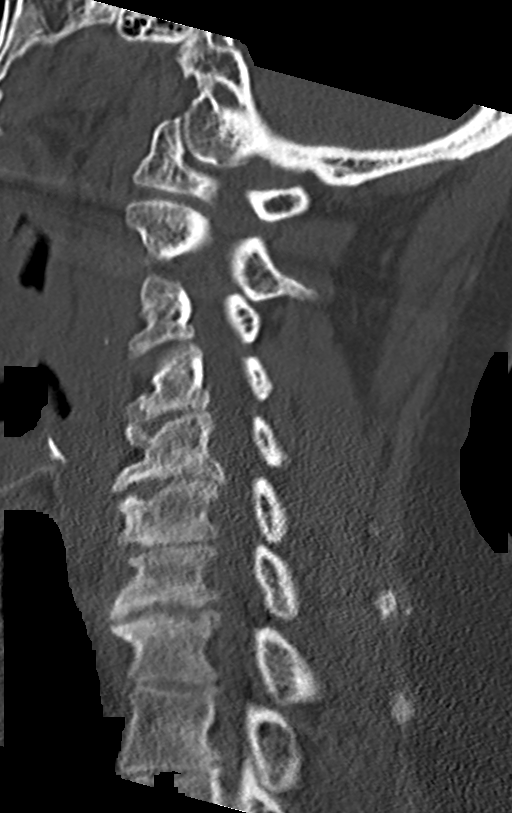
[im 32/48  bone]
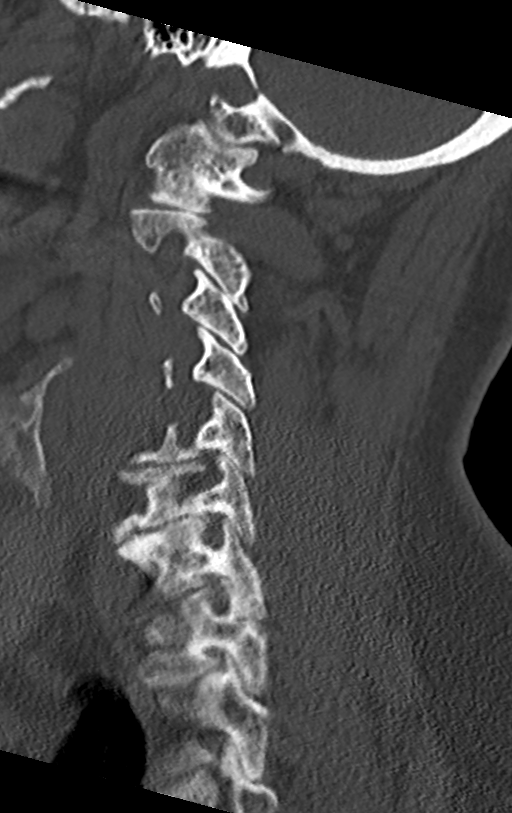

[12 of 35 positions shown; findings below may reference images not displayed]

FINDINGS: Alignment: No acute subluxation.

Skull base and vertebrae: No acute fracture.  Osteopenia.

Soft tissues and spinal canal: No prevertebral fluid or swelling. No
visible canal hematoma.

Disc levels: Multilevel degenerative changes with disc space
narrowing and endplate irregularity and spurring.

Upper chest: Negative.

Other: None
IMPRESSION: 1. No acute fracture or subluxation of the cervical spine.
2. Multilevel degenerative changes.

## 2021-05-04 NOTE — ED Triage Notes (Signed)
Pt arrives to ED POV c/o sharp upper back pain that radiates between shoulder and down both arms. Pt states this has been going on for a week. Pt sates this pain comes and goes x1 year, denies any falls or injury, unaware what is causing pain. Pt was prescribed Tizanidine but states pain has not gone away.Pain 10/10.  ?

## 2021-05-04 NOTE — ED Provider Triage Note (Signed)
Emergency Medicine Provider Triage Evaluation Note ? ?Brenda Nelson , a 66 y.o. female  was evaluated in triage.  Pt complains of shoulder pain. Recurrent bilateral shoulders pain radiates to arm x 1 year, worse in the past week ? ?Review of Systems  ?Positive: Bilateral shoulders pain, arm pain ?Negative: Fever, neck pain, headache, cp, sob, abd pain ? ?Physical Exam  ?BP (!) 128/58   Pulse 78   Temp 98 ?F (36.7 ?C) (Oral)   Resp 16   SpO2 97%  ?Gen:   Awake, no distress   ?Resp:  Normal effort  ?MSK:   Moves extremities without difficulty  ?Other:  Ttp to bilateral shoulders with intact ROM ? ?Medical Decision Making  ?Medically screening exam initiated at 9:34 PM.  Appropriate orders placed.  Brenda Nelson was informed that the remainder of the evaluation will be completed by another provider, this initial triage assessment does not replace that evaluation, and the importance of remaining in the ED until their evaluation is complete. ? ? ?  ?Fayrene Helper, PA-C ?05/04/21 2144 ? ?

## 2021-05-05 ENCOUNTER — Emergency Department (HOSPITAL_COMMUNITY): Payer: Medicare Other

## 2021-05-05 IMAGING — CR DG CHEST 2V
2 series · 2 of 2 positions shown · non-contrast
Comparison: [DATE]

CLINICAL DATA: Posterior chest and midthoracic pain.

EXAM:
CHEST - 2 VIEW

[chest pa]
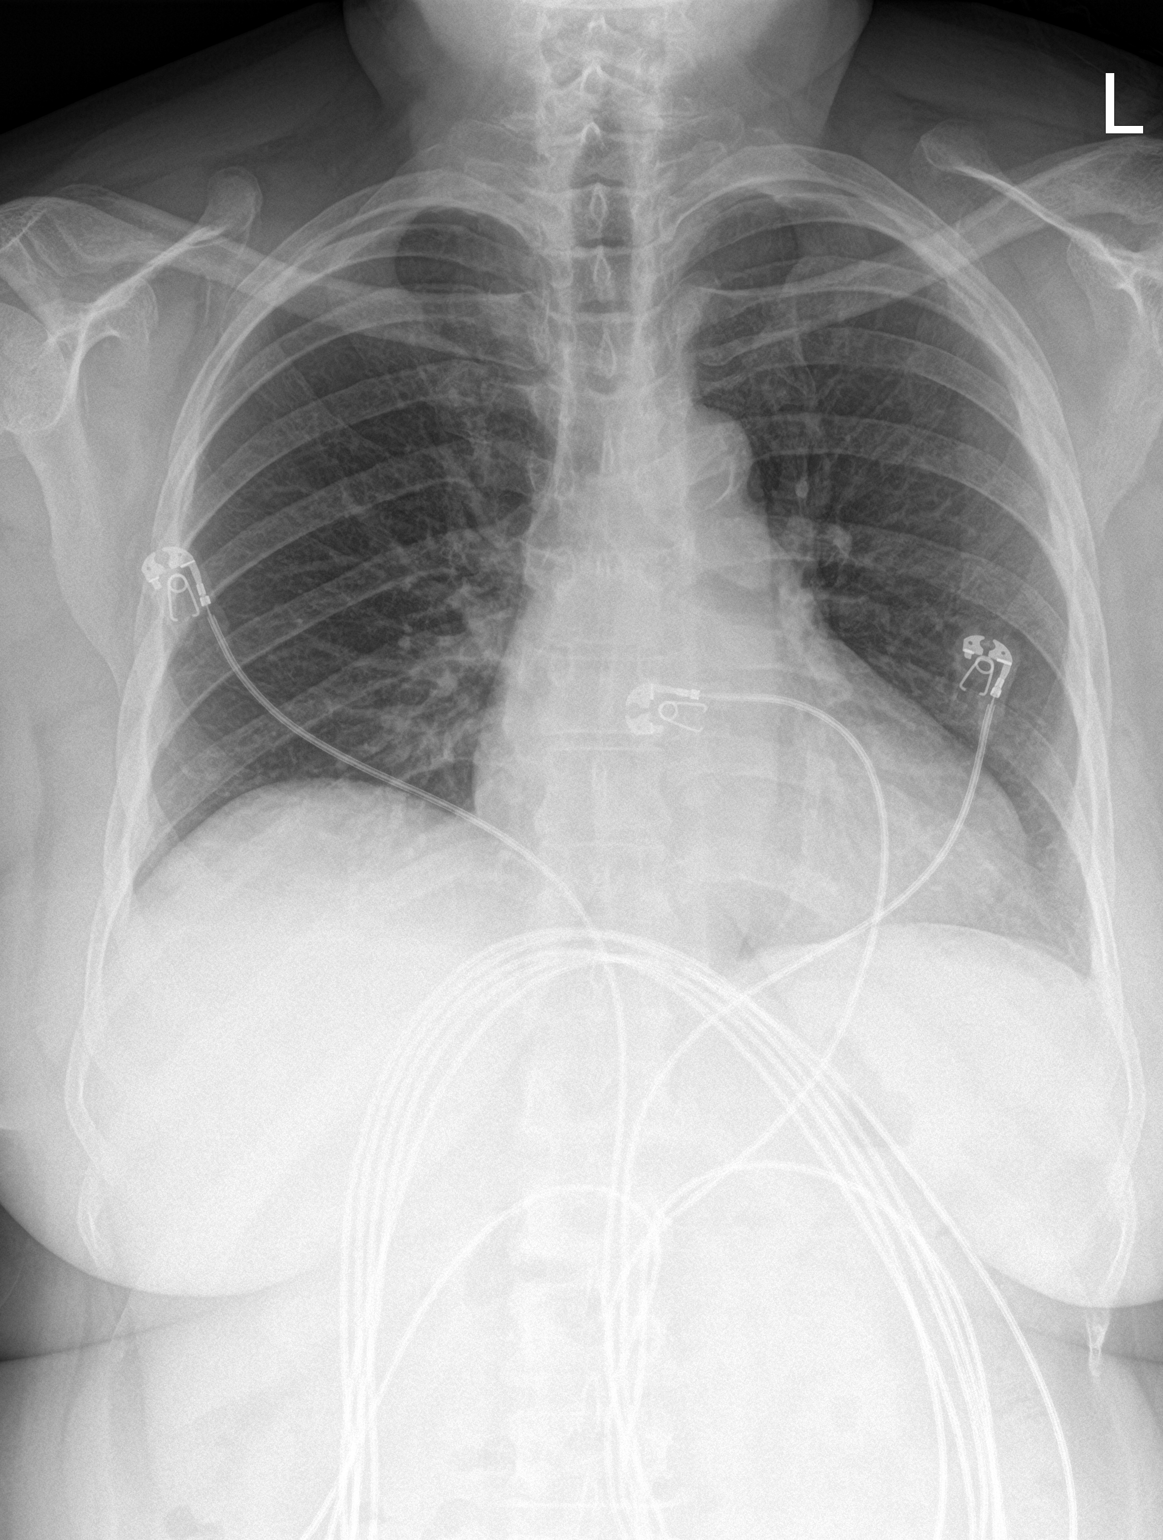

[chest lat]
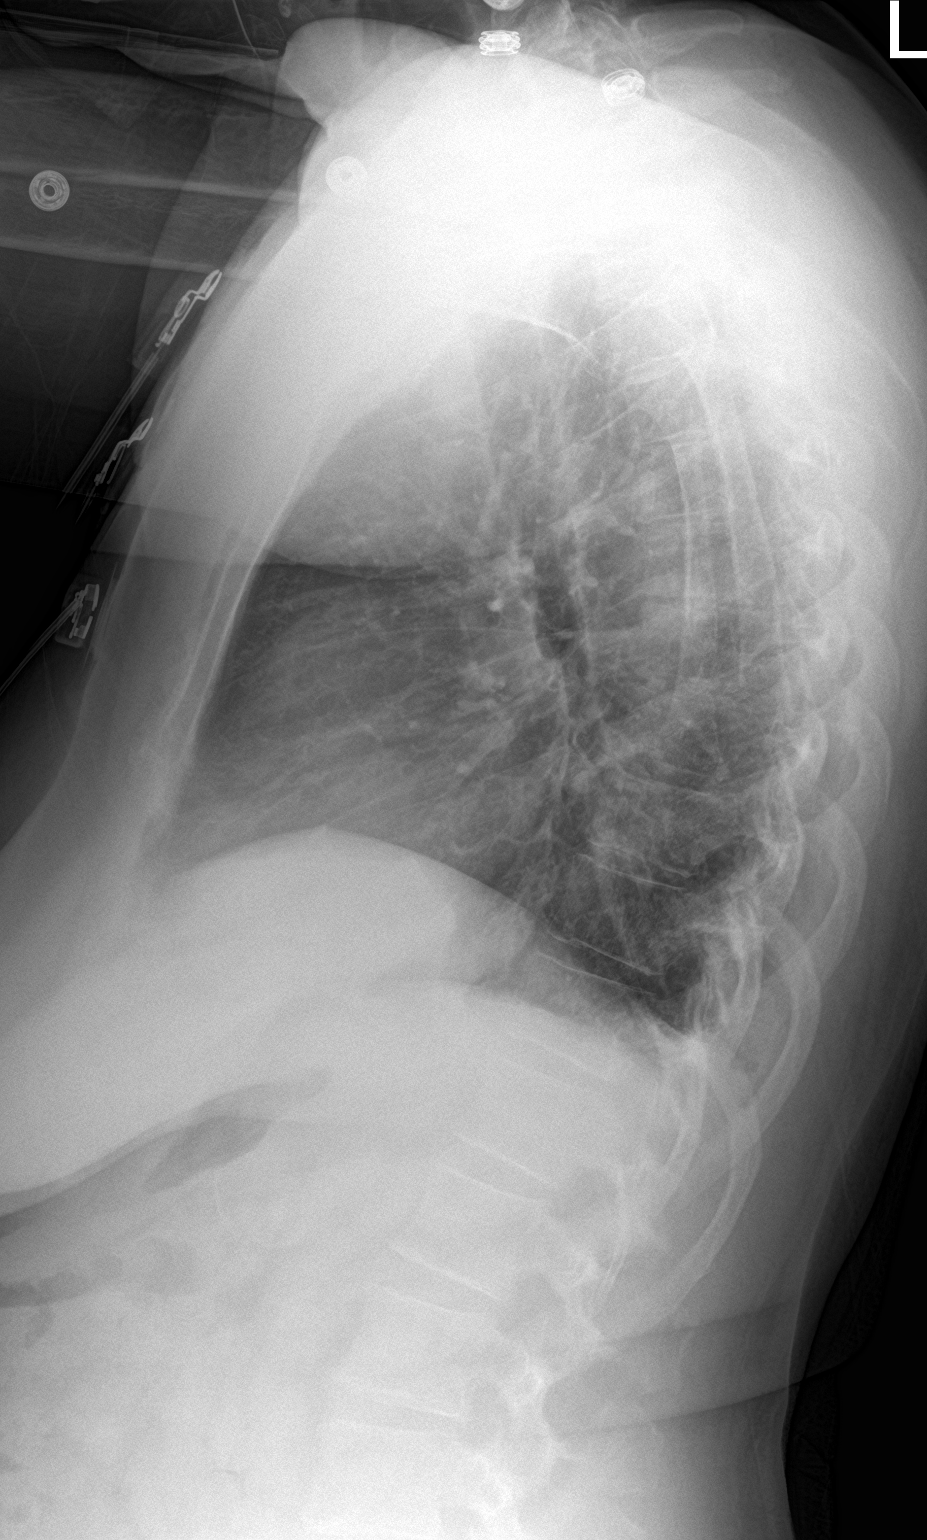

[2 of 2 positions shown; findings below may reference images not displayed]

FINDINGS: Normal heart size and pulmonary vascularity. No focal airspace
disease or consolidation in the lungs. No blunting of costophrenic
angles. No pneumothorax. Mediastinal contours appear intact.
Calcification of the aorta.
IMPRESSION: No active cardiopulmonary disease.

## 2021-05-05 IMAGING — CR DG THORACIC SPINE 3V
3 series · 3 of 3 positions shown · non-contrast
Comparison: None.

CLINICAL DATA: Acute upper back pain and posterior midthoracic
pain.

EXAM:
THORACIC SPINE - 3 VIEWS

[t-spine lat]
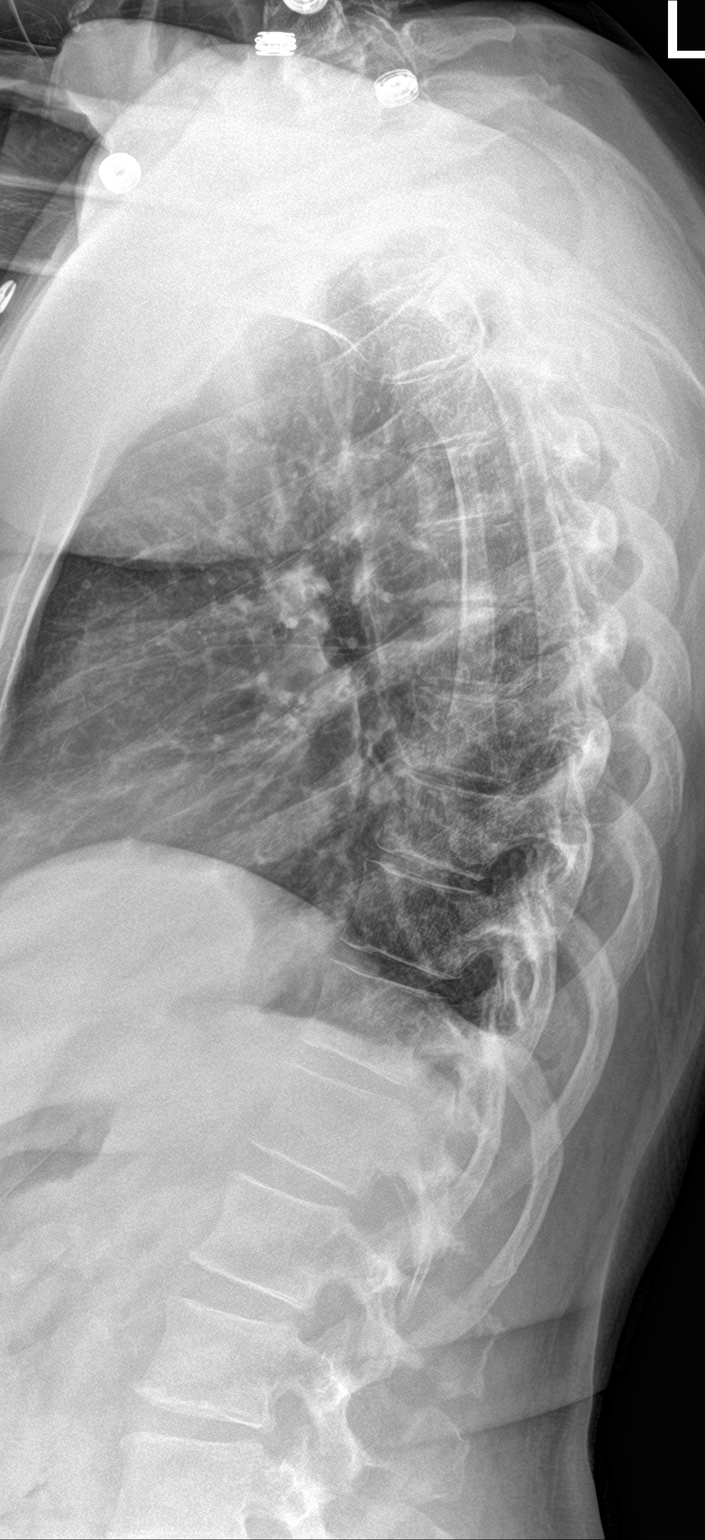

[t-spine swimmers]
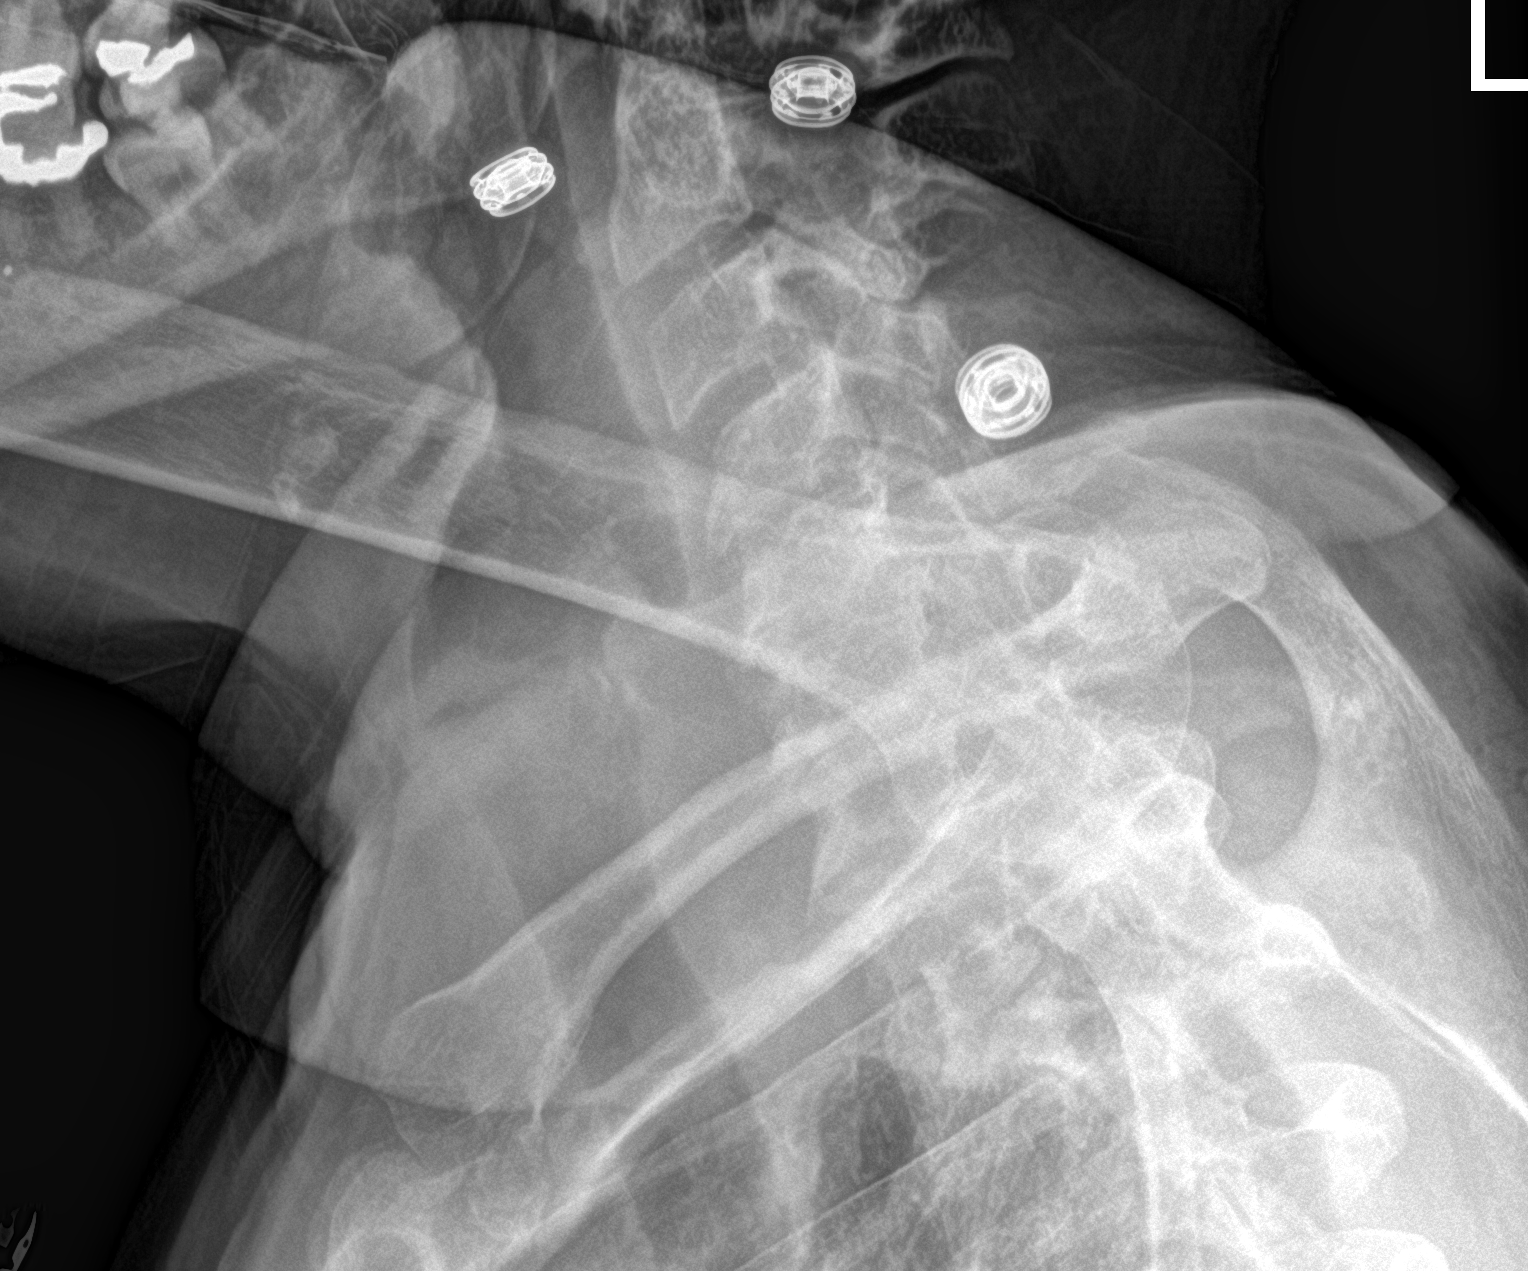

[t-spine ap]
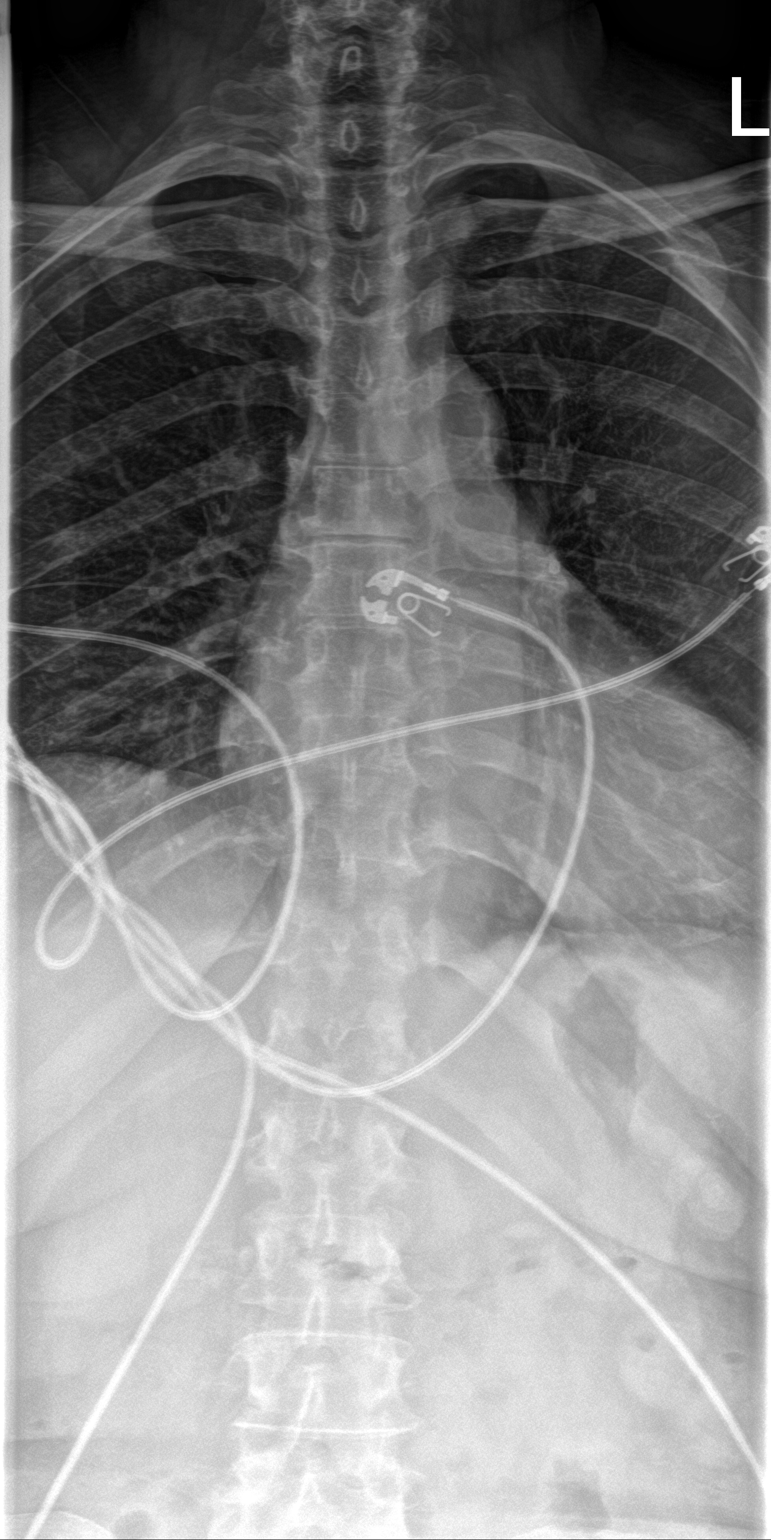

[3 of 3 positions shown; findings below may reference images not displayed]

FINDINGS: Normal alignment of the thoracic vertebrae. No vertebral compression
deformities. No focal bone lesion or bone destruction. Mild disc
space narrowing and endplate osteophyte formation in the midthoracic
region consistent with degenerative change. No abnormal paraspinal
soft tissue mass or infiltration.
IMPRESSION: Mild degenerative changes in the thoracic spine. Normal alignment.
No acute displaced fractures identified.

## 2021-05-05 MED ORDER — PREDNISONE 10 MG PO TABS: 30.0000 mg | ORAL_TABLET | Freq: Every day | ORAL | 0 refills | Status: DC

## 2021-05-05 MED ORDER — OXYCODONE-ACETAMINOPHEN 5-325 MG PO TABS: 1.0000 | ORAL_TABLET | Freq: Three times a day (TID) | ORAL | 0 refills | Status: AC | PRN

## 2021-05-05 MED ORDER — OXYCODONE-ACETAMINOPHEN 5-325 MG PO TABS
1.0000 | ORAL_TABLET | Freq: Once | ORAL | Status: AC
  Administered 2021-05-05: 1 via ORAL
  Filled 2021-05-05: qty 1

## 2021-05-05 NOTE — ED Provider Notes (Signed)
?[facility] ?Provider Note ? ? ?CSN: [id number] ?Arrival date & time: 05/04/21  2122 ? ?  ? ?History ? ?Chief Complaint  ?Patient presents with  ? Back Pain  ? ? ?Brenda AhmadiDeborah K Nelson is a 66 y.o. female. ? ?HPI ? ?Patient without significant medical history presents with complaints of thoracic spine pain.  Patient states has been going on for about a week's time, states it came on when she got up in the morning, has pain in the middle of her thoracic spine pain is worsened with movement improved with rest, will occasionally feel pain rating down her arms bilaterally, she has no paresthesias or weakness in her arms, she denies  trauma the area, she does state that the day before she was cleaning the floors unsure if she strained it.  She states that she has had pain like this in the past has been going on for about a years time, typically pain is relieved with over-the-counter pain medication.  She states she is here because she is unable to control the pain, she states that she went to her primary care doctor who gave her muscle laxer has not been helping her.  She has no connective tissue disorders, no history of dissections or AAA's.  Pain is not radiating to her chest, does not describe as a stabbing or tearing like sensation, no shortness of breath, she has not complaints at this time. ? ?Family member was at bedside able to validate the story. ? ?Home Medications ?Prior to Admission medications   ?Medication Sig Start Date End Date Taking? Authorizing Provider  ?Calcium Carb-Cholecalciferol 500-10 MG-MCG TABS Take 1 tablet by mouth daily. 03/01/21  Yes [provider]  ?fexofenadine-pseudoephedrine (ALLEGRA-D 24) 180-240 MG 24 hr tablet Take 1 tablet by mouth daily as needed (allergies).   Yes [provider]  ?hydrOXYzine (ATARAX) 25 MG tablet Take 25 mg by mouth 2 (two) times daily as needed for anxiety (agitation). 04/29/21  Yes [provider]   ?oxyCODONE-acetaminophen (PERCOCET/ROXICET) 5-325 MG tablet Take 1 tablet by mouth every 8 (eight) hours as needed for up to 3 days for severe pain. 05/05/21 05/08/21 Yes Carroll SageFaulkner, Kylyn Mcdade J, PA-C  ?predniSONE (DELTASONE) 10 MG tablet Take 3 tablets (30 mg total) by mouth daily. 05/05/21  Yes Carroll SageFaulkner, Laren Orama J, PA-C  ?tiZANidine (ZANAFLEX) 4 MG tablet Take 4 mg by mouth every 6 (six) hours as needed for muscle spasms.   Yes [provider]  ?traZODone (DESYREL) 100 MG tablet Take 200 mg by mouth at bedtime. 04/21/21  Yes [provider]  ?ziprasidone (GEODON) 40 MG capsule Take 40 mg by mouth every evening. 04/29/21  Yes [provider]  ?   ? ?Allergies    ?Lamotrigine   ? ?Review of Systems   ?Review of Systems  ?Constitutional:  Negative for chills and fever.  ?Respiratory:  Negative for shortness of breath.   ?Cardiovascular:  Negative for chest pain.  ?Gastrointestinal:  Negative for abdominal pain.  ?Musculoskeletal:  Positive for back pain.  ?Neurological:  Negative for headaches.  ? ?Physical Exam ?Updated Vital Signs ?BP (!) 111/59   Pulse (!) 57   Temp 98 ?F (36.7 ?C) (Oral)   Resp 17   Ht 5\' 4"  (1.626 m)   Wt 79.8 kg   SpO2 97%   BMI 30.21 kg/m?  ?Physical Exam ?Vitals and nursing note reviewed.  ?Constitutional:   ?   General: She is not in acute distress. ?  Appearance: She is not ill-appearing.  ?HENT:  ?   Head: Normocephalic and atraumatic.  ?   Nose: No congestion.  ?Eyes:  ?   Conjunctiva/sclera: Conjunctivae normal.  ?Cardiovascular:  ?   Rate and Rhythm: Normal rate and regular rhythm.  ?   Pulses: Normal pulses.  ?   Heart sounds: No murmur heard. ?  No friction rub. No gallop.  ?Pulmonary:  ?   Effort: No respiratory distress.  ?   Breath sounds: No wheezing, rhonchi or rales.  ?Musculoskeletal:  ?   Comments: Spine was palpated she had tenderness along her thoracic spine, no crepitus or deformities noted, there is no overlying skin changes, she has full range of  motion in the upper extremities, 5 out of 5 strength neurovascular fully intact.  ?Skin: ?   General: Skin is warm and dry.  ?Neurological:  ?   Mental Status: She is alert.  ?Psychiatric:     ?   Mood and Affect: Mood normal.  ? ? ?ED Results / Procedures / Treatments   ?Labs ?(all labs ordered are listed, but only abnormal results are displayed) ?Labs Reviewed - No data to display ? ?EKG ?EKG Interpretation ? ?Date/Time:  Thursday May 05 2021 00:04:34 EDT ?Ventricular Rate:  70 ?PR Interval:  171 ?QRS Duration: 83 ?QT Interval:  401 ?QTC Calculation: 433 ?R Axis:   50 ?Text Interpretation: Sinus rhythm Anteroseptal infarct, age indeterminate When compared with ECG of 02/17/2015, No significant change was found Confirmed by Dione Booze ([id number]) on 05/05/2021 12:30:46 AM ? ?Radiology ?DG Chest 2 View ? ?Result Date: 05/05/2021 ?CLINICAL DATA:  Posterior chest and midthoracic pain. EXAM: CHEST - 2 VIEW COMPARISON:  11/27/2015 FINDINGS: Normal heart size and pulmonary vascularity. No focal airspace disease or consolidation in the lungs. No blunting of costophrenic angles. No pneumothorax. Mediastinal contours appear intact. Calcification of the aorta. IMPRESSION: No active cardiopulmonary disease. Electronically Signed   By: Burman Nieves M.D.   On: 05/05/2021 01:12  ? ?DG Thoracic Spine W/Swimmers ? ?Result Date: 05/05/2021 ?CLINICAL DATA:  Acute upper back pain and posterior midthoracic pain. EXAM: THORACIC SPINE - 3 VIEWS COMPARISON:  None. FINDINGS: Normal alignment of the thoracic vertebrae. No vertebral compression deformities. No focal bone lesion or bone destruction. Mild disc space narrowing and endplate osteophyte formation in the midthoracic region consistent with degenerative change. No abnormal paraspinal soft tissue mass or infiltration. IMPRESSION: Mild degenerative changes in the thoracic spine. Normal alignment. No acute displaced fractures identified. Electronically Signed   By: Burman Nieves  M.D.   On: 05/05/2021 01:13  ? ?CT Cervical Spine Wo Contrast ? ?Result Date: 05/05/2021 ?CLINICAL DATA:  Cervical radiculopathy. EXAM: CT CERVICAL SPINE WITHOUT CONTRAST TECHNIQUE: Multidetector CT imaging of the cervical spine was performed without intravenous contrast. Multiplanar CT image reconstructions were also generated. RADIATION DOSE REDUCTION: This exam was performed according to the departmental dose-optimization program which includes automated exposure control, adjustment of the mA and/or kV according to patient size and/or use of iterative reconstruction technique. COMPARISON:  None. FINDINGS: Alignment: No acute subluxation. Skull base and vertebrae: No acute fracture.  Osteopenia. Soft tissues and spinal canal: No prevertebral fluid or swelling. No visible canal hematoma. Disc levels: Multilevel degenerative changes with disc space narrowing and endplate irregularity and spurring. Upper chest: Negative. Other: None IMPRESSION: 1. No acute fracture or subluxation of the cervical spine. 2. Multilevel degenerative changes. Electronically Signed   By: Elgie Collard M.D.   On: 05/05/2021 00:06   ? ?  Procedures ?Procedures  ? ? ?Medications Ordered in ED ?Medications  ?oxyCODONE-acetaminophen (PERCOCET/ROXICET) 5-325 MG per tablet 1 tablet (1 tablet Oral Given 05/05/21 0123)  ? ? ?ED Course/ Medical Decision Making/ A&P ?  ?                        ?Medical Decision Making ?Amount and/or Complexity of Data Reviewed ?Radiology: ordered. ? ?Risk ?Prescription drug management. ? ? ?This patient presents to the ED for concern of back pain, this involves an extensive number of treatment options, and is a complaint that carries with it a high risk of complications and morbidity.  The differential diagnosis includes fracture, dislocation, spinal cord abnormality, dissection ? ? ? ?Additional history obtained: ? ?Additional history obtained from family member who is at bedside ?External records from outside source  obtained and reviewed including N/A ? ? ?Co morbidities that complicate the patient evaluation ? ?N/A ? ?Social Determinants of Health: ? ?N/A ? ? ? ?Lab Tests: ? ?I Ordered, and personally interpreted labs.

## 2021-05-05 NOTE — Discharge Instructions (Signed)
You have been seen here for back pain I recommend taking over-the-counter pain medications like ibuprofen and/or Tylenol every 6 as needed.  Please follow dosage and on the back of bottle.  I also recommend applying heat to the area and stretching out the muscles as this will help decrease stiffness and pain.  I have given you information on exercises please follow. ? ?Given you a short course of narcotic medication please take as prescribed, you may continue with the muscle laxer that prescribed to you, also given short course of steroids. ? ?I have given you a short course of narcotics please take as prescribed.  This medication can make you drowsy do not consume alcohol or operate heavy machinery when taking this medication.  This medication is Tylenol in it do not take Tylenol and take this medication.  I have also given you a prescription for a muscle relaxer this can make you drowsy do not consume alcohol or operate heavy machinery when taking this medication.  ? ?You may follow-up with your PCP and/or neurosurgery for further evaluation. ? ?Come back to the emergency department if you develop chest pain, shortness of breath, severe abdominal pain, uncontrolled nausea, vomiting, diarrhea. ? ? ? ?

## 2021-06-02 ENCOUNTER — Other Ambulatory Visit: Payer: Self-pay | Admitting: Neurosurgery

## 2021-06-02 DIAGNOSIS — M5412 Radiculopathy, cervical region: Secondary | ICD-10-CM

## 2021-06-02 DIAGNOSIS — M25511 Pain in right shoulder: Secondary | ICD-10-CM

## 2021-06-11 ENCOUNTER — Ambulatory Visit
Admission: RE | Admit: 2021-06-11 | Discharge: 2021-06-11 | Disposition: A | Discharge status: Home / Self Care | Payer: Medicare Other | Source: Ambulatory Visit | Attending: Neurosurgery | Admitting: Neurosurgery

## 2021-06-11 DIAGNOSIS — M5412 Radiculopathy, cervical region: Secondary | ICD-10-CM

## 2021-06-11 DIAGNOSIS — M25511 Pain in right shoulder: Secondary | ICD-10-CM

## 2021-06-11 IMAGING — MR MR SHOULDER*R* W/O CM
4 of 5 series · 21 of 40 positions shown · non-contrast
Comparison: None.

CLINICAL DATA: Shoulder pain for 3-4 months.  No known injury.

EXAM:
MRI OF THE RIGHT SHOULDER WITHOUT CONTRAST
TECHNIQUE: Multiplanar, multisequence MR imaging of the shoulder was performed.
No intravenous contrast was administered.

[Series 6: T2 fat-sat · axial · right · 3.0mm · 0.59mm/px · z∈[-57,+53]mm · 8 of 30 slices shown (1 of 3)]
[im 1/30]
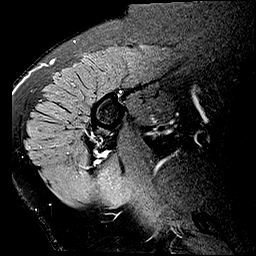
[im 4/30]
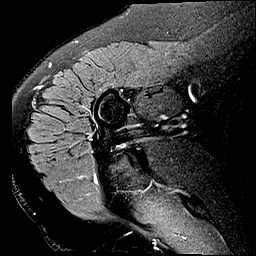
[im 10/30]
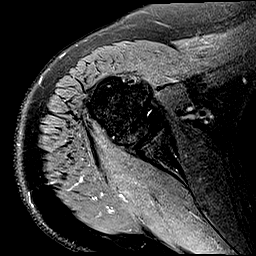
[im 13/30]
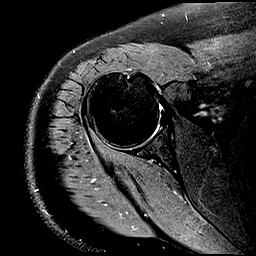
[im 17/30]
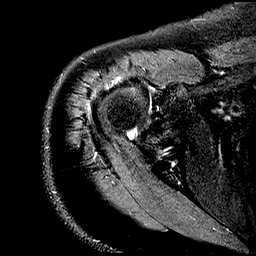
[im 20/30]
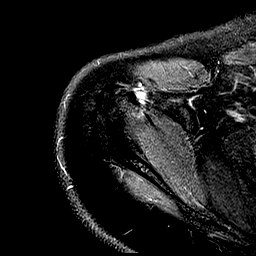
[im 26/30]
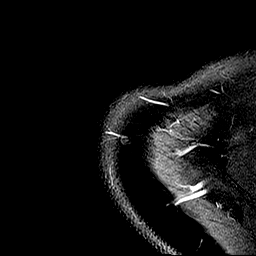
[im 30/30]
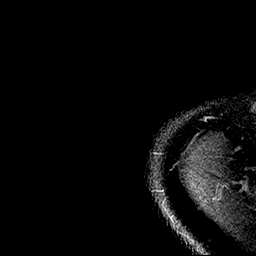

[Series 7: T2 fat-sat · oblique · right · 4.0mm · 0.23mm/px · 3 of 21 slices shown (2 of 3)]
[im 4/21]
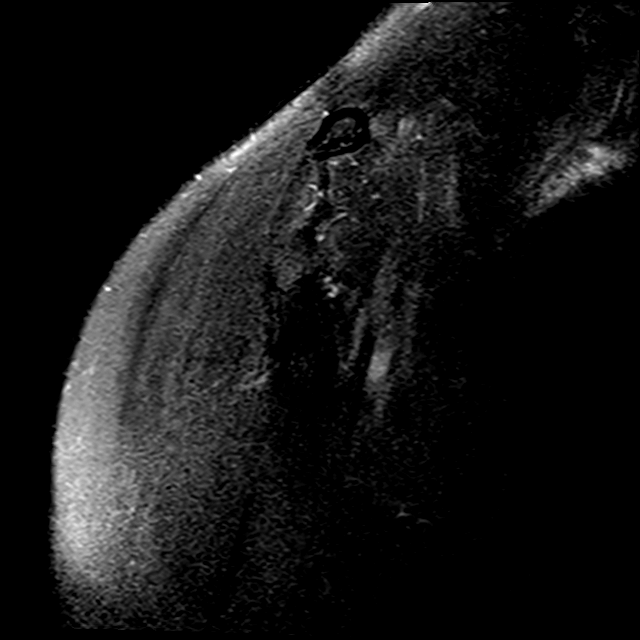
[im 11/21]
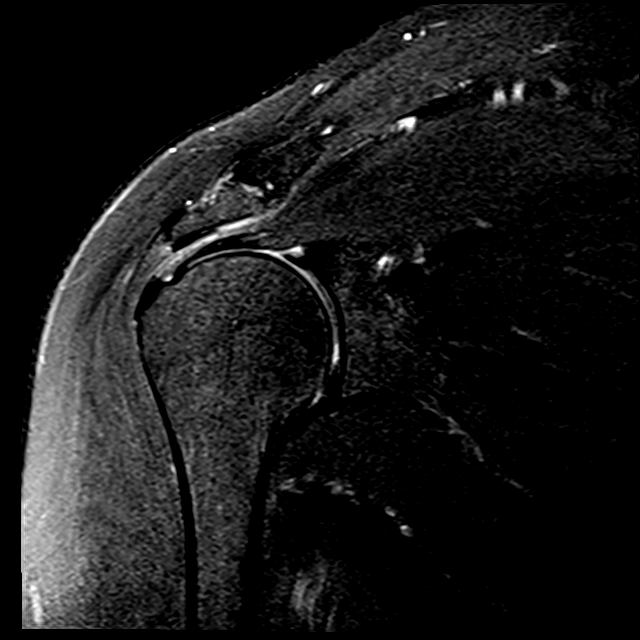
[im 17/21]
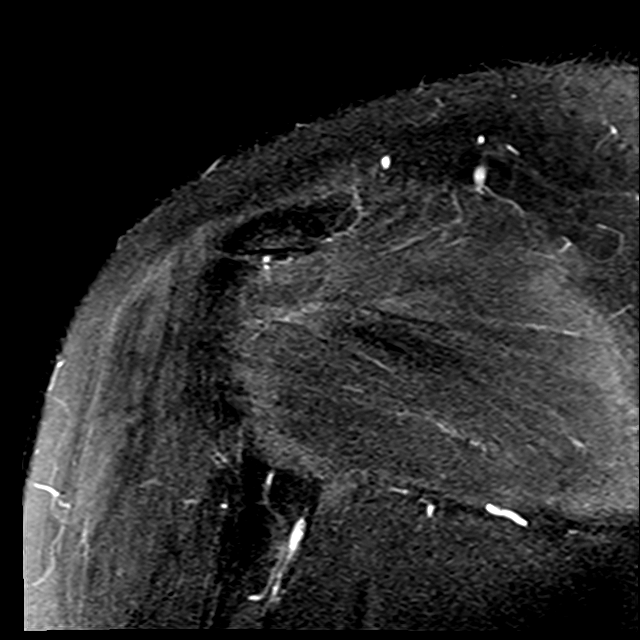

[Series 8: PD · oblique · right · 4.0mm · 0.23mm/px · 7 of 21 slices shown]
[im 1/21]
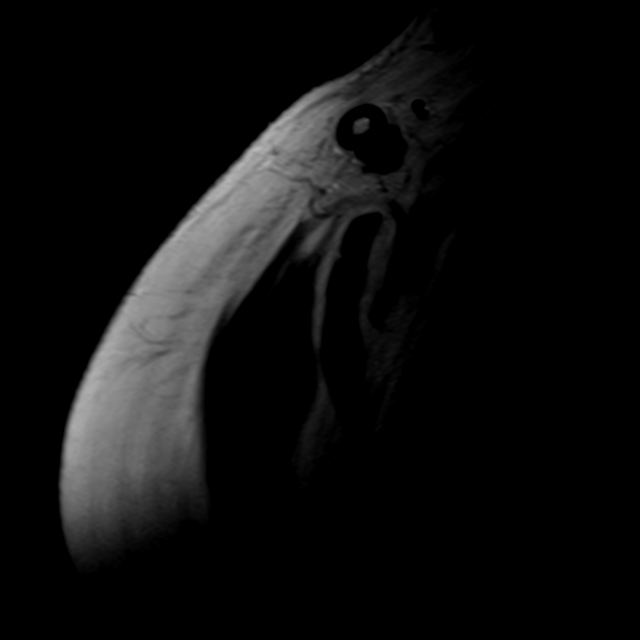
[im 4/21]
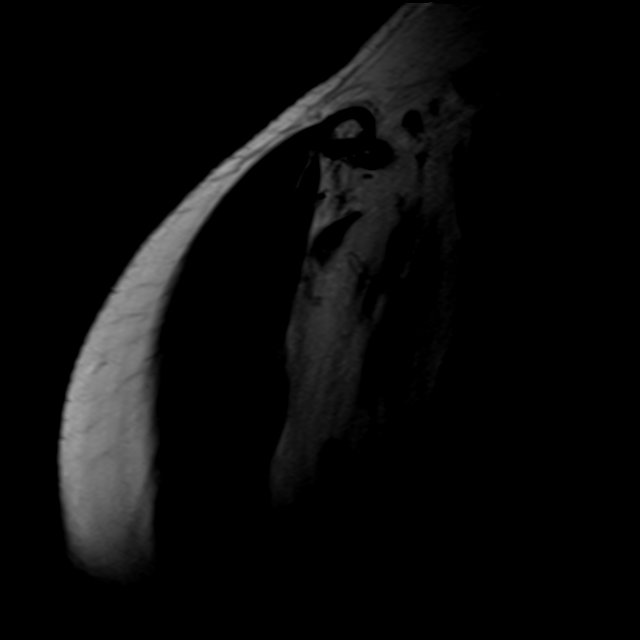
[im 7/21]
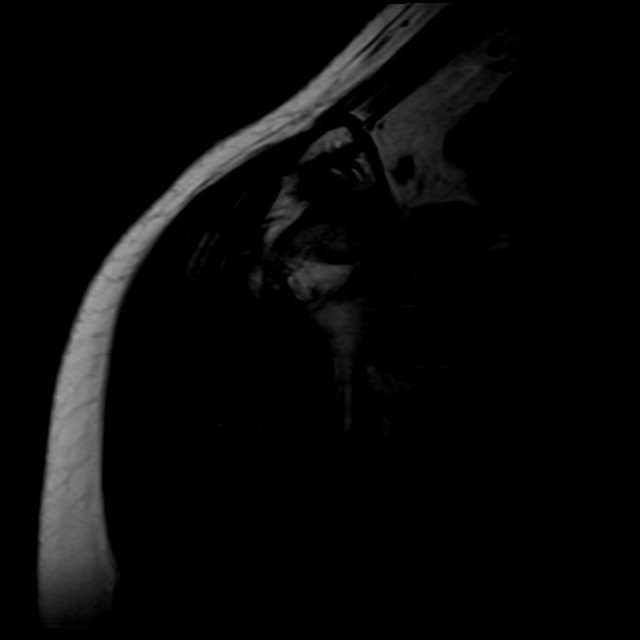
[im 11/21]
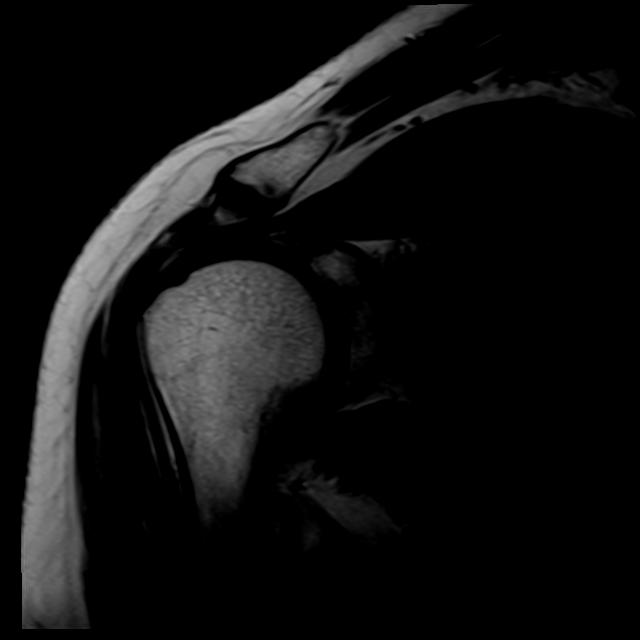
[im 14/21]
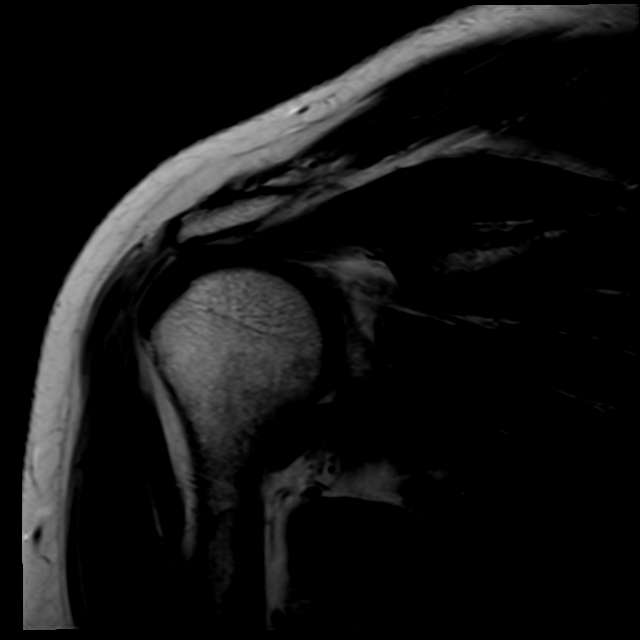
[im 17/21]
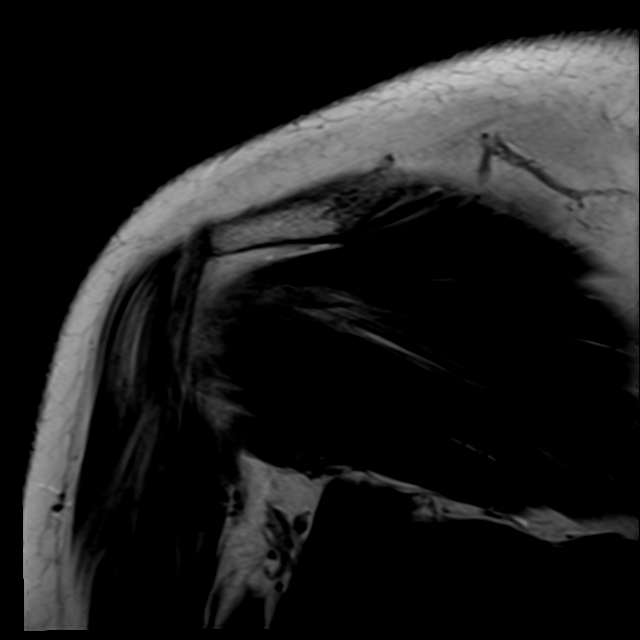
[im 21/21]
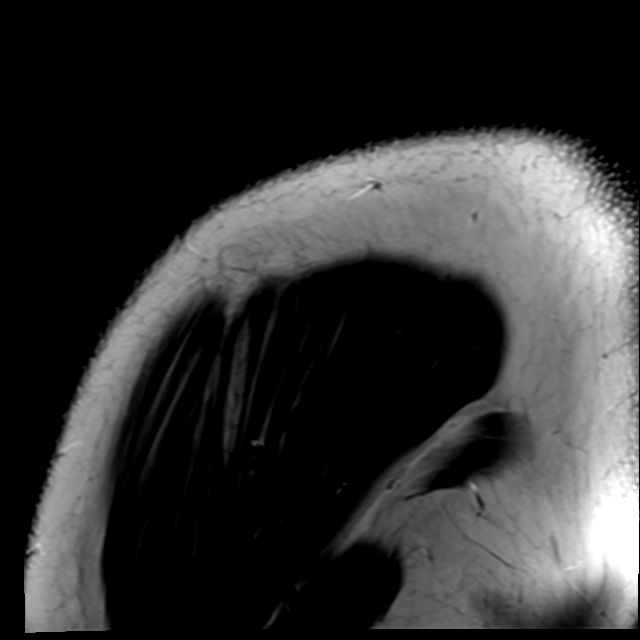

[Series 9: T2 fat-sat · oblique · right · 4.0mm · 0.47mm/px · 3 of 23 slices shown (3 of 3)]
[im 4/23]
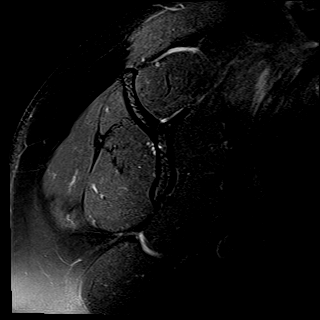
[im 13/23]
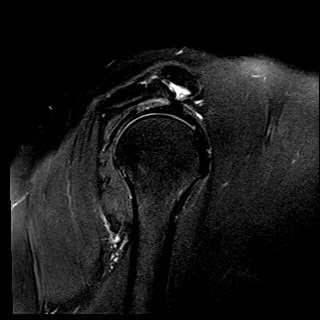
[im 19/23]
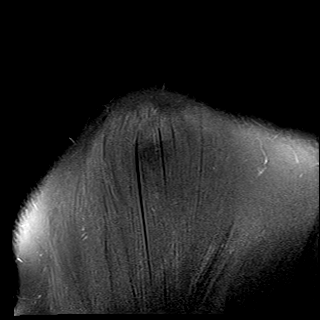

[21 of 40 positions shown; findings below may reference images not displayed]

FINDINGS: Rotator cuff: Tendinopathy with partial-thickness bursal surface
tear of the supraspinatus. Infraspinatus tendon is intact. Teres
minor tendon is intact. Subscapularis tendon is intact.

Muscles: No muscle atrophy or edema. No intramuscular fluid
collection or hematoma.

Biceps Long Head: Intraarticular and extraarticular portions of the
biceps tendon are intact.

Acromioclavicular Joint: Mild arthropathy of the acromioclavicular
joint. No subacromial/subdeltoid bursal fluid.

Glenohumeral Joint: No joint effusion. No chondral defect.

Labrum: Grossly intact, but evaluation is limited by lack of
intraarticular fluid/contrast.

Bones: No fracture or dislocation. No aggressive osseous lesion.

Other: No fluid collection or hematoma.
IMPRESSION: 1. Mild tendinopathy with low-grade bursal surface tear of the
supraspinatus.

2.  Mild acromioclavicular osteoarthritis.

3. No significant arthropathy or joint effusion of the glenohumeral
joint.

4.  No evidence of fracture or osteonecrosis.

## 2021-06-11 IMAGING — MR MR CERVICAL SPINE W/O CM
5 series · 34 of 48 positions shown · non-contrast
Comparison: None.

CLINICAL DATA: Right shoulder pain and bilateral neck pain.

EXAM:
MRI CERVICAL SPINE WITHOUT CONTRAST
TECHNIQUE: Multiplanar, multisequence MR imaging of the cervical spine was
performed. No intravenous contrast was administered.

[Series 5: T2 · sagittal · 3.0mm · 0.66mm/px · 8 of 15 slices shown (1 of 2)]
[im 1/15]
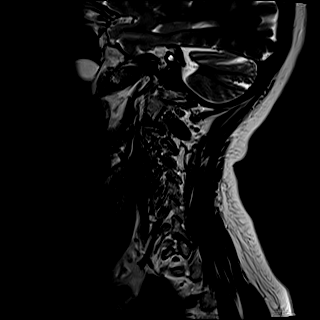
[im 3/15]
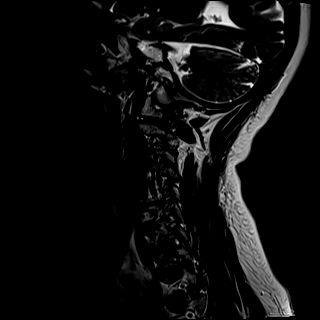
[im 5/15]
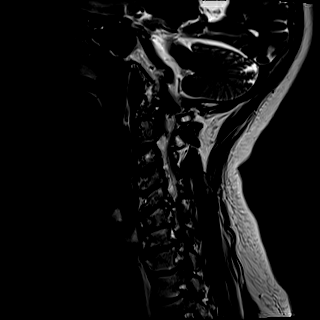
[im 7/15]
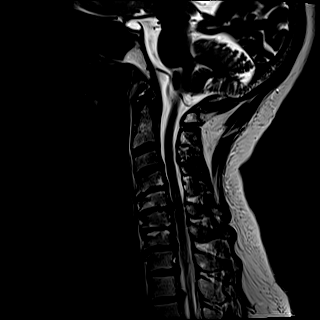
[im 9/15]
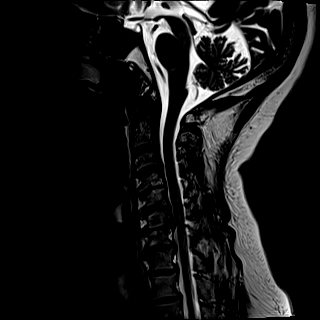
[im 11/15]
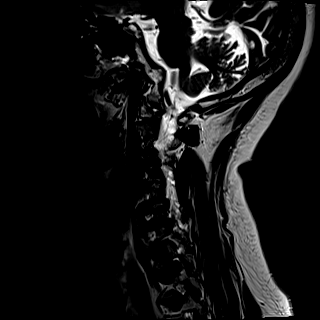
[im 13/15]
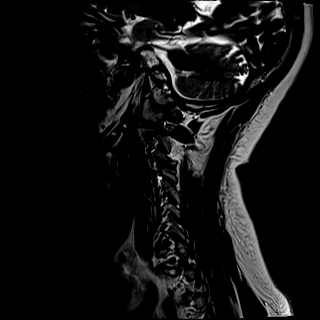
[im 15/15]
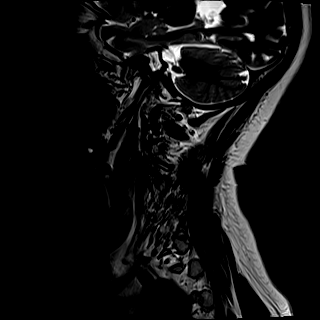

[Series 8: T1 · sagittal · 3.0mm · 0.66mm/px · 7 of 15 slices shown]
[im 1/15]
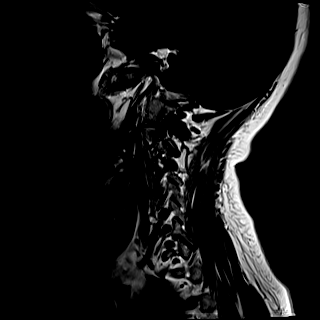
[im 3/15]
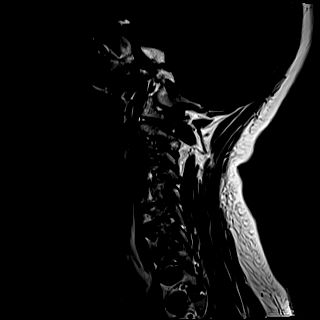
[im 5/15]
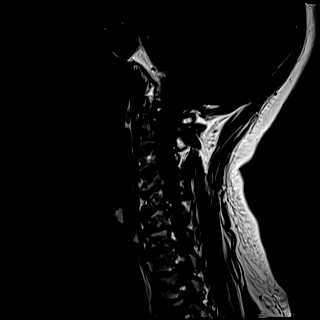
[im 8/15]
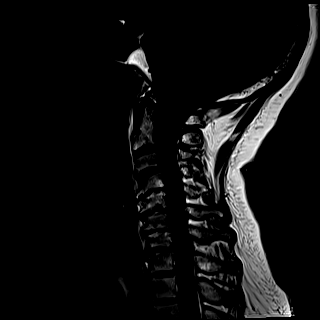
[im 10/15]
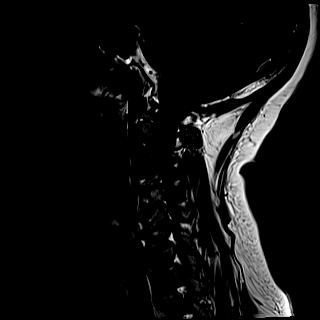
[im 12/15]
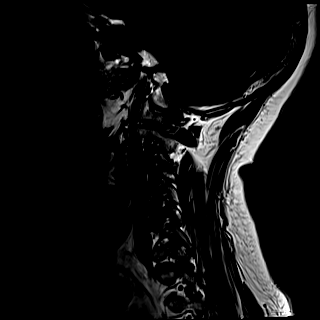
[im 15/15]
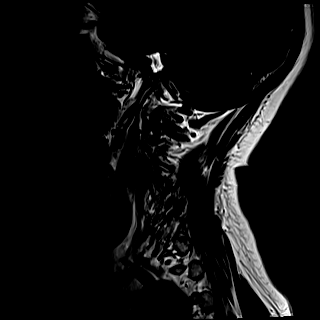

[Series 9: STIR · sagittal · 3.0mm · 0.41mm/px · 7 of 15 slices shown]
[im 1/15]
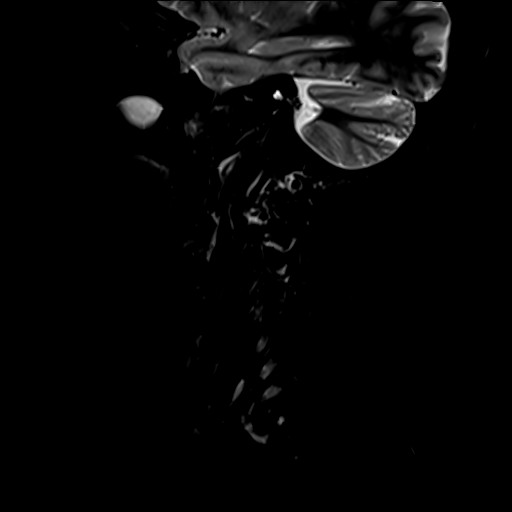
[im 3/15]
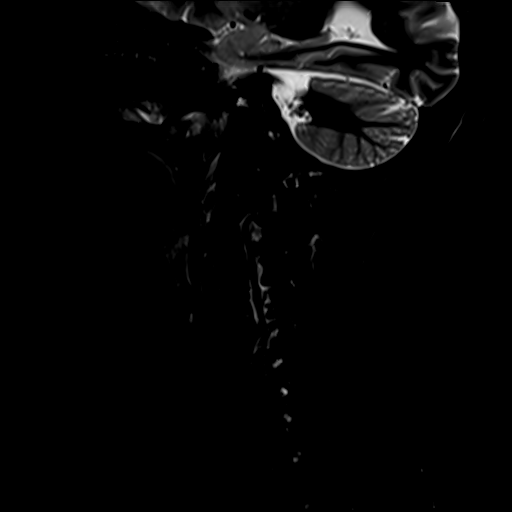
[im 5/15]
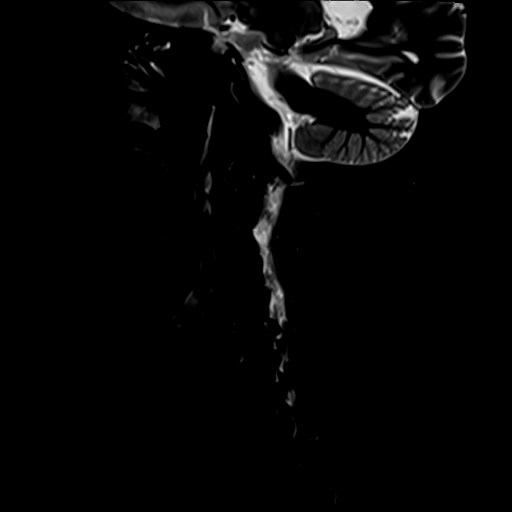
[im 8/15]
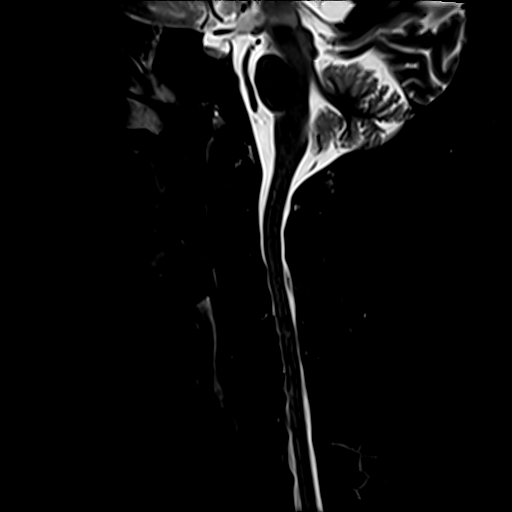
[im 10/15]
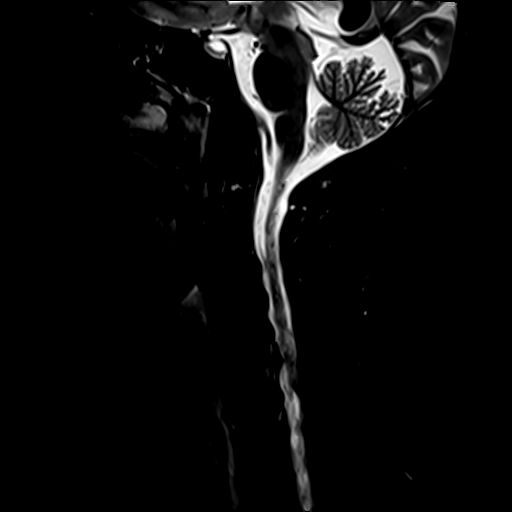
[im 12/15]
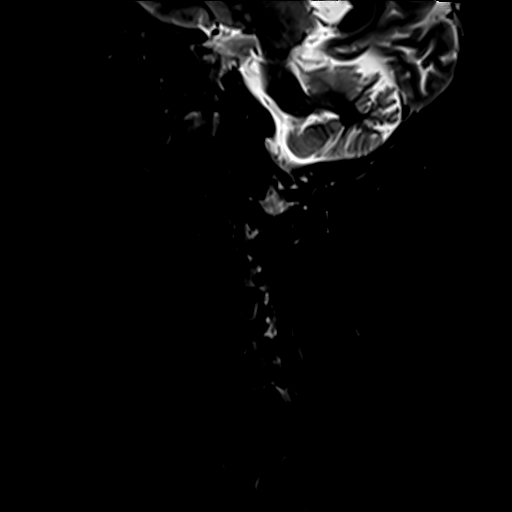
[im 15/15]
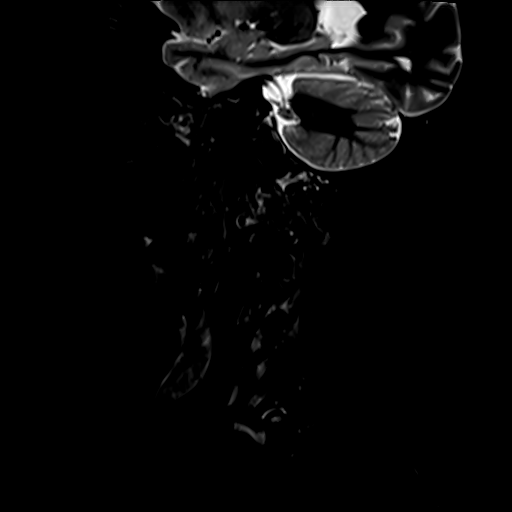

[Series 10: T2 · axial · 3.0mm · 0.50mm/px · z∈[-62,+22]mm · 9 of 28 slices shown (2 of 2)]
[im 1/28]
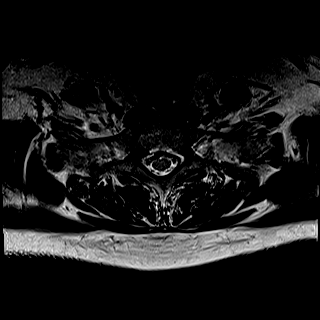
[im 5/28]
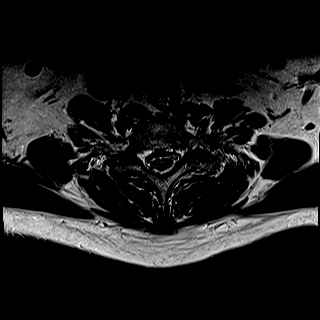
[im 10/28]
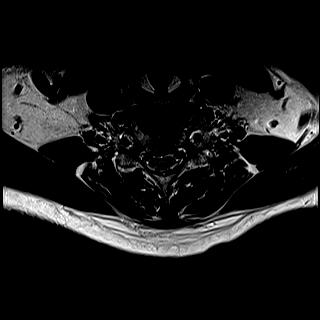
[im 12/28]
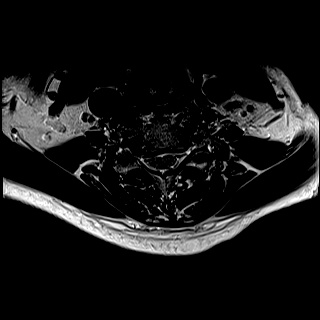
[im 14/28]
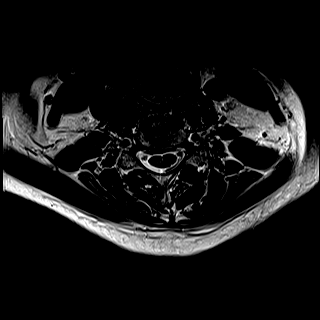
[im 16/28]
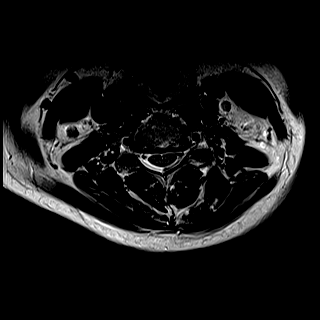
[im 19/28]
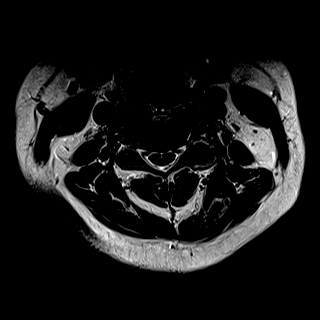
[im 23/28]
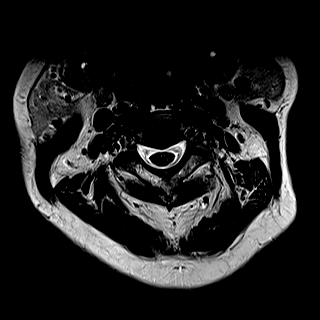
[im 28/28]
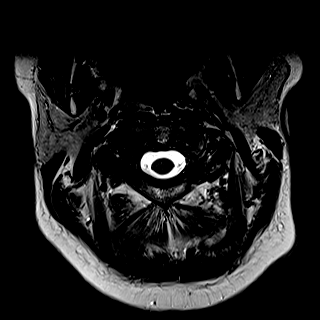

[Series 11: GRE · axial · 3.0mm · 0.42mm/px · z∈[-62,-34]mm · 3 of 28 slices shown]
[im 1/28]
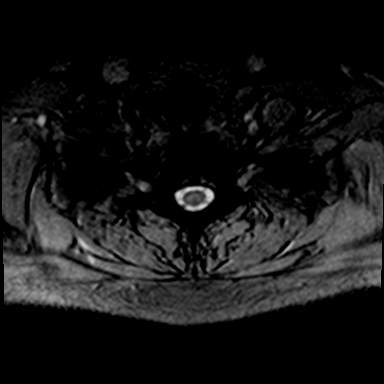
[im 5/28]
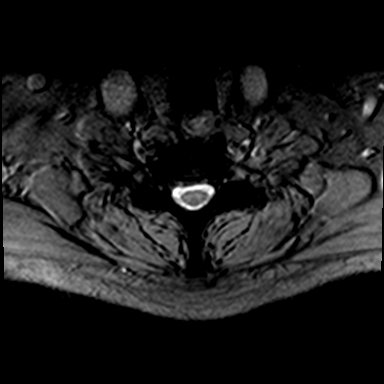
[im 10/28]
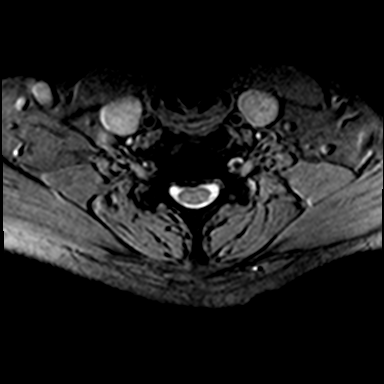

[34 of 48 positions shown; findings below may reference images not displayed]

FINDINGS: Alignment: Physiologic.

Vertebrae: No fracture, evidence of discitis, or bone lesion.

Cord: Normal signal and morphology.

Posterior Fossa, vertebral arteries, paraspinal tissues: Negative.

Disc levels:

C1-2: Unremarkable.

C2-3: Normal disc space and facet joints. There is no spinal canal
stenosis. No neural foraminal stenosis.

C3-4: Small disc bulge and mild left facet hypertrophy. There is no
spinal canal stenosis. No neural foraminal stenosis.

C4-5: Small disc bulge with bilateral uncovertebral hypertrophy.
There is no spinal canal stenosis. Mild right neural foraminal
stenosis.

C5-6: Right asymmetric disc bulge with endplate spurring. Mild
spinal canal stenosis. Severe right neural foraminal stenosis.

C6-7: Left asymmetric disc bulge with uncovertebral hypertrophy
bilaterally. There is no spinal canal stenosis. Mild left neural
foraminal stenosis.

C7-T1: Small disc bulge with uncovertebral hypertrophy. There is no
spinal canal stenosis. No neural foraminal stenosis.
IMPRESSION: 1. Multilevel degenerative disc disease, worst at C5-6 where there
is mild spinal canal stenosis and severe right neural foraminal
stenosis.
2. Mild right C4-5 and left C6-7 neural foraminal stenosis.

## 2021-06-23 ENCOUNTER — Ambulatory Visit: Payer: Medicare Other | Admitting: Physician Assistant

## 2021-06-24 ENCOUNTER — Other Ambulatory Visit: Payer: Self-pay | Admitting: Neurosurgery

## 2021-08-02 NOTE — Pre-Procedure Instructions (Signed)
Surgical Instructions    Your procedure is scheduled on Wednesday, August 10, 2021.  Report to [facility] Main Entrance "A" at 7:45 A.M., then check in with the Admitting office.  Call this number if you have problems the morning of surgery:  [phone]   If you have any questions prior to your surgery date call [phone]: Open Monday-Friday 8am-4pm    Remember:  Do not eat or drink after midnight the night before your surgery    Take these medicines the morning of surgery with A SIP OF WATER:  NONE   Take these medicines the morning of surgery AS NEEDED:  tiZANidine (ZANAFLEX)     As of today, STOP taking any Aspirin (unless otherwise instructed by your surgeon) Aleve, Naproxen, Ibuprofen, Motrin, Advil, Goody's, BC's, all herbal medications, fish oil, and all vitamins.                     Do NOT Smoke (Tobacco/Vaping) for 24 hours prior to your procedure.  If you use a CPAP at night, you may bring your mask/headgear for your overnight stay.   Contacts, glasses, piercing's, hearing aid's, dentures or partials may not be worn into surgery, please bring cases for these belongings.    For patients admitted to the hospital, discharge time will be determined by your treatment team.   Patients discharged the day of surgery will not be allowed to drive home, and someone needs to stay with them for 24 hours.  SURGICAL WAITING ROOM VISITATION Patients having surgery or a procedure may have two support people in the waiting room. These visitors may be switched out with other visitors if needed. Children under the age of 31 must have an adult accompany them who is not the patient. If the patient needs to stay at the hospital during part of their recovery, the visitor guidelines for inpatient rooms apply.  Please refer to the [facility] website for the visitor guidelines for Inpatients (after your surgery is over and you are in a regular room).    Special instructions:   [facility]- Preparing For Surgery  Before surgery, you can play an important role. Because skin is not sterile, your skin needs to be as free of germs as possible. You can reduce the number of germs on your skin by washing with CHG (chlorahexidine gluconate) Soap before surgery.  CHG is an antiseptic cleaner which kills germs and bonds with the skin to continue killing germs even after washing.    Oral Hygiene is also important to reduce your risk of infection.  Remember - BRUSH YOUR TEETH THE MORNING OF SURGERY WITH YOUR REGULAR TOOTHPASTE  Please do not use if you have an allergy to CHG or antibacterial soaps. If your skin becomes reddened/irritated stop using the CHG.  Do not shave (including legs and underarms) for at least 48 hours prior to first CHG shower. It is OK to shave your face.  Please follow these instructions carefully.   Shower the NIGHT BEFORE SURGERY and the MORNING OF SURGERY  If you chose to wash your hair, wash your hair first as usual with your normal shampoo.  After you shampoo, rinse your hair and body thoroughly to remove the shampoo.  Use CHG Soap as you would any other liquid soap. You can apply CHG directly to the skin and wash gently with a scrungie or a clean washcloth.   Apply the CHG Soap to your body ONLY FROM THE NECK DOWN.  Do  not use on open wounds or open sores. Avoid contact with your eyes, ears, mouth and genitals (private parts). Wash Face and genitals (private parts)  with your normal soap.   Wash thoroughly, paying special attention to the area where your surgery will be performed.  Thoroughly rinse your body with warm water from the neck down.  DO NOT shower/wash with your normal soap after using and rinsing off the CHG Soap.  Pat yourself dry with a CLEAN TOWEL.  Wear CLEAN PAJAMAS to bed the night before surgery  Place CLEAN SHEETS on your bed the night before your surgery  DO NOT SLEEP WITH PETS.   Day of Surgery: Take a shower with CHG  soap. Do not wear jewelry or makeup Do not wear lotions, powders, perfumes/colognes, or deodorant. Do not shave 48 hours prior to surgery.  Men may shave face and neck. Do not bring valuables to the hospital.  [facility] is not responsible for any belongings or valuables. Do not wear nail polish, gel polish, artificial nails, or any other type of covering on natural nails (fingers and toes) If you have artificial nails or gel coating that need to be removed by a nail salon, please have this removed prior to surgery. Artificial nails or gel coating may interfere with anesthesia's ability to adequately monitor your vital signs. Wear Clean/Comfortable clothing the morning of surgery Remember to brush your teeth WITH YOUR REGULAR TOOTHPASTE.   Please read over the following fact sheets that you were given.    If you received a COVID test during your pre-op visit  it is requested that you wear a mask when out in public, stay away from anyone that may not be feeling well and notify your surgeon if you develop symptoms. If you have been in contact with anyone that has tested positive in the last 10 days please notify you surgeon.

## 2021-08-03 ENCOUNTER — Other Ambulatory Visit: Payer: Self-pay

## 2021-08-03 ENCOUNTER — Encounter (HOSPITAL_COMMUNITY)
Admission: RE | Admit: 2021-08-03 | Discharge: 2021-08-03 | Disposition: A | Discharge status: Home / Self Care | Payer: Medicare Other | Source: Ambulatory Visit | Attending: Neurosurgery | Admitting: Neurosurgery

## 2021-08-03 ENCOUNTER — Encounter (HOSPITAL_COMMUNITY): Payer: Self-pay

## 2021-08-03 VITALS — BP 144/77 | HR 66 | Temp 98.1°F | Resp 17 | Ht 65.0 in | Wt 188.1 lb

## 2021-08-03 DIAGNOSIS — Z01818 Encounter for other preprocedural examination: Secondary | ICD-10-CM

## 2021-08-03 DIAGNOSIS — Z01812 Encounter for preprocedural laboratory examination: Secondary | ICD-10-CM | POA: Insufficient documentation

## 2021-08-03 LAB — CBC
HCT: 39.4 % (ref 36.0–46.0)
Hemoglobin: 12.7 g/dL (ref 12.0–15.0)
MCH: 28.3 pg (ref 26.0–34.0)
MCHC: 32.2 g/dL (ref 30.0–36.0)
MCV: 87.8 fL (ref 80.0–100.0)
Platelets: 304 10*3/uL (ref 150–400)
RBC: 4.49 MIL/uL (ref 3.87–5.11)
RDW: 13.2 % (ref 11.5–15.5)
WBC: 6.1 10*3/uL (ref 4.0–10.5)
nRBC: 0 % (ref 0.0–0.2)

## 2021-08-03 LAB — SURGICAL PCR SCREEN
MRSA, PCR: NEGATIVE
Staphylococcus aureus: NEGATIVE

## 2021-08-03 LAB — TYPE AND SCREEN
ABO/RH(D): A POS
Antibody Screen: NEGATIVE

## 2021-08-03 NOTE — Progress Notes (Signed)
PCP - Dr. Fleet Contras Cardiologist - Denies  PPM/ICD - Denies Device Orders -  Rep Notified -   Chest x-ray - 05/05/21 EKG - 05/05/21 Stress Test - "A long time ago" no cardiology f/u ECHO - 12/30/04 Cardiac Cath - Denies  Sleep Study - Denies  DM - Denies Fasting Blood Sugar -  Checks Blood Sugar _____ times a day  Blood Thinner Instructions: Denies Aspirin Instructions:  ERAS Protcol - Denies PRE-SURGERY Ensure or G2-   COVID TEST- NI   Anesthesia review: No  Patient denies shortness of breath, fever, cough and chest pain at PAT appointment   All instructions explained to the patient, with a verbal understanding of the material. Patient agrees to go over the instructions while at home for a better understanding.  The opportunity to ask questions was provided.

## 2021-08-09 NOTE — Anesthesia Preprocedure Evaluation (Addendum)
Anesthesia Evaluation  Patient identified by MRN, date of birth, ID band Patient awake    Reviewed: Allergy & Precautions, NPO status , Patient's Chart, lab work & pertinent test results  History of Anesthesia Complications Negative for: history of anesthetic complications  Airway Mallampati: II  TM Distance: >3 FB Neck ROM: Full    Dental  (+) Caps, Dental Advisory Given   Pulmonary neg pulmonary ROS,    breath sounds clear to auscultation       Cardiovascular negative cardio ROS   Rhythm:Regular Rate:Normal     Neuro/Psych Bipolar Disorder    GI/Hepatic negative GI ROS, Neg liver ROS,   Endo/Other  obese  Renal/GU negative Renal ROS     Musculoskeletal   Abdominal (+) + obese,   Peds  Hematology negative hematology ROS (+)   Anesthesia Other Findings   Reproductive/Obstetrics                            Anesthesia Physical Anesthesia Plan  ASA: 2  Anesthesia Plan: General   Post-op Pain Management: Tylenol PO (pre-op)* and Toradol IV (intra-op)*   Induction: Intravenous  PONV Risk Score and Plan: 3 and Ondansetron, Dexamethasone and Diphenhydramine  Airway Management Planned: Oral ETT and Video Laryngoscope Planned  Additional Equipment: None  Intra-op Plan:   Post-operative Plan: Extubation in OR  Informed Consent: I have reviewed the patients History and Physical, chart, labs and discussed the procedure including the risks, benefits and alternatives for the proposed anesthesia with the patient or authorized representative who has indicated his/her understanding and acceptance.     Dental advisory given  Plan Discussed with: CRNA and Surgeon  Anesthesia Plan Comments:        Anesthesia Quick Evaluation

## 2021-08-10 ENCOUNTER — Ambulatory Visit (HOSPITAL_BASED_OUTPATIENT_CLINIC_OR_DEPARTMENT_OTHER): Payer: Medicare Other | Admitting: Anesthesiology

## 2021-08-10 ENCOUNTER — Other Ambulatory Visit: Payer: Self-pay

## 2021-08-10 ENCOUNTER — Ambulatory Visit (HOSPITAL_COMMUNITY): Payer: Medicare Other | Admitting: Anesthesiology

## 2021-08-10 ENCOUNTER — Ambulatory Visit (HOSPITAL_COMMUNITY)
Admission: RE | Admit: 2021-08-10 | Discharge: 2021-08-11 | Disposition: A | Discharge status: Home / Self Care | Payer: Medicare Other | Attending: Neurosurgery | Admitting: Neurosurgery

## 2021-08-10 ENCOUNTER — Encounter (HOSPITAL_COMMUNITY)
Admission: RE | Disposition: A | Discharge status: Home / Self Care | Payer: Self-pay | Source: Home / Self Care | Attending: Neurosurgery

## 2021-08-10 ENCOUNTER — Ambulatory Visit (HOSPITAL_COMMUNITY): Payer: Medicare Other

## 2021-08-10 ENCOUNTER — Encounter (HOSPITAL_COMMUNITY): Payer: Self-pay | Admitting: Neurosurgery

## 2021-08-10 DIAGNOSIS — F319 Bipolar disorder, unspecified: Secondary | ICD-10-CM | POA: Insufficient documentation

## 2021-08-10 DIAGNOSIS — M4722 Other spondylosis with radiculopathy, cervical region: Secondary | ICD-10-CM | POA: Insufficient documentation

## 2021-08-10 DIAGNOSIS — M4802 Spinal stenosis, cervical region: Secondary | ICD-10-CM | POA: Insufficient documentation

## 2021-08-10 DIAGNOSIS — Z6831 Body mass index (BMI) 31.0-31.9, adult: Secondary | ICD-10-CM | POA: Insufficient documentation

## 2021-08-10 DIAGNOSIS — E669 Obesity, unspecified: Secondary | ICD-10-CM | POA: Insufficient documentation

## 2021-08-10 HISTORY — PX: ANTERIOR CERVICAL DECOMP/DISCECTOMY FUSION: SHX1161

## 2021-08-10 LAB — ABO/RH: ABO/RH(D): A POS

## 2021-08-10 IMAGING — RF DG CERVICAL SPINE 1V
1 series · 2 of 2 positions shown · non-contrast
Comparison: None Available.

CLINICAL DATA: C5-7 ACDF

EXAM:
DG CERVICAL SPINE - 1 VIEW

[Series 1: run · 2 of 2 slices shown]
[im 1/2]
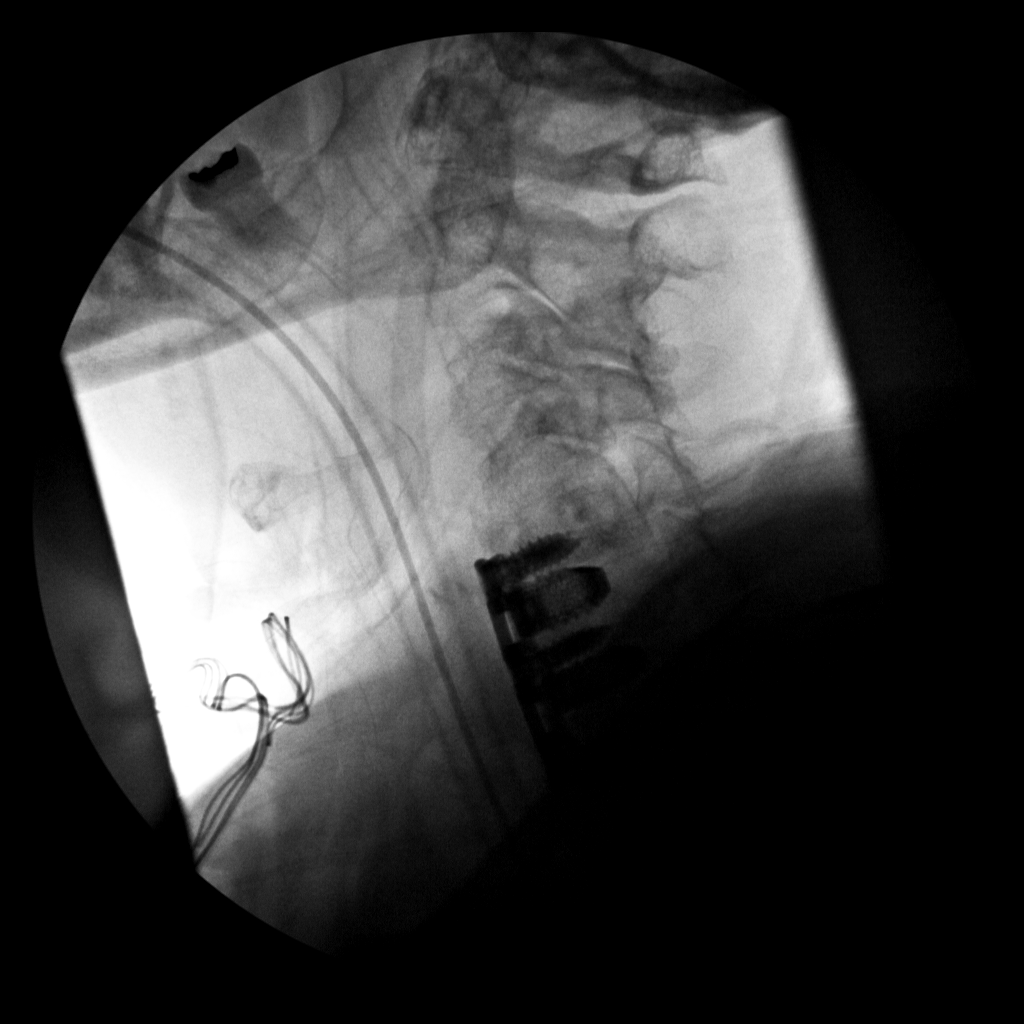
[im 2/2]
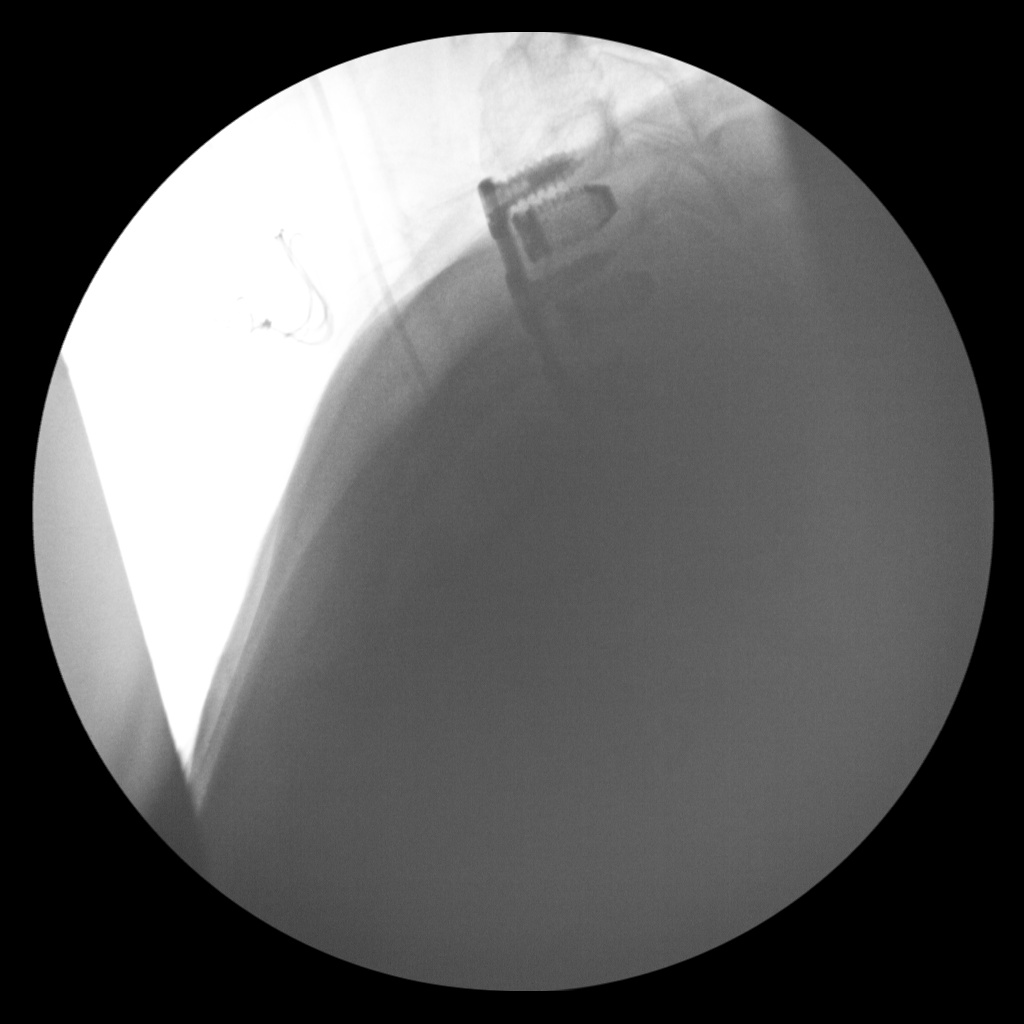

[2 of 2 positions shown; findings below may reference images not displayed]

FINDINGS: Fluoroscopic images were obtained intraoperatively and submitted for
post operative interpretation. C5-7 ACDF with hardware in expected
position, evaluation of the lower portion of the hardware somewhat
limited due to overlying soft tissue, 2 images were obtained with 13
seconds of fluoroscopy time and 2.66 mGy. Partially visualized
endotracheal tube. Please see the performing provider's procedural
report for further detail.
IMPRESSION: Fluoroscopic images demonstrate C5-7 ACDF.

## 2021-08-10 SURGERY — ANTERIOR CERVICAL DECOMPRESSION/DISCECTOMY FUSION 2 LEVELS
Anesthesia: General

## 2021-08-10 MED ORDER — MIDAZOLAM HCL 2 MG/2ML IJ SOLN: 0.5000 mg | Freq: Once | INTRAMUSCULAR | Status: DC | PRN

## 2021-08-10 MED ORDER — ALUM & MAG HYDROXIDE-SIMETH 200-200-20 MG/5ML PO SUSP: 30.0000 mL | Freq: Four times a day (QID) | ORAL | Status: DC | PRN

## 2021-08-10 MED ORDER — TIZANIDINE HCL 4 MG PO TABS: 4.0000 mg | ORAL_TABLET | Freq: Four times a day (QID) | ORAL | Status: DC | PRN

## 2021-08-10 MED ORDER — CHLORHEXIDINE GLUCONATE 0.12 % MT SOLN
15.0000 mL | Freq: Once | OROMUCOSAL | Status: AC
  Administered 2021-08-10: 15 mL via OROMUCOSAL
  Filled 2021-08-10: qty 15

## 2021-08-10 MED ORDER — PHENYLEPHRINE HCL-NACL 20-0.9 MG/250ML-% IV SOLN
INTRAVENOUS | Status: DC | PRN
  Administered 2021-08-10: 25 ug/min via INTRAVENOUS

## 2021-08-10 MED ORDER — ACETAMINOPHEN 325 MG PO TABS: 650.0000 mg | ORAL_TABLET | ORAL | Status: DC | PRN

## 2021-08-10 MED ORDER — MENTHOL 3 MG MT LOZG: 1.0000 | LOZENGE | OROMUCOSAL | Status: DC | PRN

## 2021-08-10 MED ORDER — 0.9 % SODIUM CHLORIDE (POUR BTL) OPTIME
TOPICAL | Status: DC | PRN
  Administered 2021-08-10 (×2): 1000 mL

## 2021-08-10 MED ORDER — LIDOCAINE 2% (20 MG/ML) 5 ML SYRINGE
INTRAMUSCULAR | Status: AC
Start: 2021-08-10 — End: ?
  Filled 2021-08-10: qty 5

## 2021-08-10 MED ORDER — PREDNISONE 20 MG PO TABS: 30.0000 mg | ORAL_TABLET | Freq: Every day | ORAL | Status: DC

## 2021-08-10 MED ORDER — SODIUM CHLORIDE 0.9% FLUSH
3.0000 mL | INTRAVENOUS | Status: DC | PRN
Start: 2021-08-10 — End: 2021-08-11

## 2021-08-10 MED ORDER — OXYCODONE HCL 5 MG PO TABS: 5.0000 mg | ORAL_TABLET | Freq: Once | ORAL | Status: DC | PRN

## 2021-08-10 MED ORDER — MEPERIDINE HCL 25 MG/ML IJ SOLN: 6.2500 mg | INTRAMUSCULAR | Status: DC | PRN

## 2021-08-10 MED ORDER — THROMBIN 5000 UNITS EX SOLR
CUTANEOUS | Status: DC | PRN
  Administered 2021-08-10: 10000 [IU] via TOPICAL

## 2021-08-10 MED ORDER — LACTATED RINGERS IV SOLN: INTRAVENOUS | Status: DC

## 2021-08-10 MED ORDER — HEMOSTATIC AGENTS (NO CHARGE) OPTIME
TOPICAL | Status: DC | PRN
  Administered 2021-08-10: 1 via TOPICAL

## 2021-08-10 MED ORDER — ZIPRASIDONE HCL 40 MG PO CAPS
60.0000 mg | ORAL_CAPSULE | Freq: Every day | ORAL | Status: DC
  Administered 2021-08-10: 60 mg via ORAL
  Filled 2021-08-10 (×2): qty 1

## 2021-08-10 MED ORDER — DIPHENHYDRAMINE HCL 50 MG/ML IJ SOLN
INTRAMUSCULAR | Status: DC | PRN
  Administered 2021-08-10: 12.5 mg via INTRAVENOUS

## 2021-08-10 MED ORDER — SODIUM CHLORIDE 0.9% FLUSH
3.0000 mL | Freq: Two times a day (BID) | INTRAVENOUS | Status: DC
  Administered 2021-08-10: 3 mL via INTRAVENOUS

## 2021-08-10 MED ORDER — CYCLOBENZAPRINE HCL 10 MG PO TABS
10.0000 mg | ORAL_TABLET | Freq: Three times a day (TID) | ORAL | Status: DC | PRN
  Administered 2021-08-10: 10 mg via ORAL
  Filled 2021-08-10: qty 1

## 2021-08-10 MED ORDER — ONDANSETRON HCL 4 MG/2ML IJ SOLN
INTRAMUSCULAR | Status: DC | PRN
  Administered 2021-08-10: 4 mg via INTRAVENOUS

## 2021-08-10 MED ORDER — LIDOCAINE 2% (20 MG/ML) 5 ML SYRINGE
INTRAMUSCULAR | Status: DC | PRN
  Administered 2021-08-10: 40 mg via INTRAVENOUS

## 2021-08-10 MED ORDER — ACETAMINOPHEN 650 MG RE SUPP: 650.0000 mg | RECTAL | Status: DC | PRN

## 2021-08-10 MED ORDER — PANTOPRAZOLE SODIUM 40 MG IV SOLR
40.0000 mg | Freq: Every day | INTRAVENOUS | Status: DC
  Administered 2021-08-10: 40 mg via INTRAVENOUS
  Filled 2021-08-10: qty 10

## 2021-08-10 MED ORDER — ACETAMINOPHEN 500 MG PO TABS
1000.0000 mg | ORAL_TABLET | Freq: Once | ORAL | Status: AC
  Administered 2021-08-10: 1000 mg via ORAL
  Filled 2021-08-10: qty 2

## 2021-08-10 MED ORDER — CHLORHEXIDINE GLUCONATE CLOTH 2 % EX PADS: 6.0000 | MEDICATED_PAD | Freq: Once | CUTANEOUS | Status: DC

## 2021-08-10 MED ORDER — DEXAMETHASONE SODIUM PHOSPHATE 10 MG/ML IJ SOLN
INTRAMUSCULAR | Status: AC
  Filled 2021-08-10: qty 1

## 2021-08-10 MED ORDER — CEFAZOLIN SODIUM-DEXTROSE 2-4 GM/100ML-% IV SOLN
2.0000 g | INTRAVENOUS | Status: AC
  Administered 2021-08-10: 2 g via INTRAVENOUS
  Filled 2021-08-10: qty 100

## 2021-08-10 MED ORDER — MIDAZOLAM HCL 2 MG/2ML IJ SOLN
INTRAMUSCULAR | Status: AC
  Filled 2021-08-10: qty 2

## 2021-08-10 MED ORDER — SODIUM CHLORIDE 0.9 % IV SOLN: 250.0000 mL | INTRAVENOUS | Status: DC

## 2021-08-10 MED ORDER — OYSTER SHELL CALCIUM/D3 500-5 MG-MCG PO TABS
ORAL_TABLET | Freq: Every day | ORAL | Status: DC
  Administered 2021-08-11: 1 via ORAL
  Filled 2021-08-10: qty 1

## 2021-08-10 MED ORDER — ONDANSETRON HCL 4 MG/2ML IJ SOLN
4.0000 mg | Freq: Four times a day (QID) | INTRAMUSCULAR | Status: DC | PRN
Start: 2021-08-10 — End: 2021-08-11

## 2021-08-10 MED ORDER — CEFAZOLIN SODIUM-DEXTROSE 2-4 GM/100ML-% IV SOLN
2.0000 g | Freq: Three times a day (TID) | INTRAVENOUS | Status: AC
  Administered 2021-08-10 – 2021-08-11 (×2): 2 g via INTRAVENOUS
  Filled 2021-08-10 (×2): qty 100

## 2021-08-10 MED ORDER — ROCURONIUM BROMIDE 10 MG/ML (PF) SYRINGE
PREFILLED_SYRINGE | INTRAVENOUS | Status: DC | PRN
  Administered 2021-08-10: 10 mg via INTRAVENOUS
  Administered 2021-08-10: 60 mg via INTRAVENOUS
  Administered 2021-08-10: 10 mg via INTRAVENOUS

## 2021-08-10 MED ORDER — SUGAMMADEX SODIUM 200 MG/2ML IV SOLN
INTRAVENOUS | Status: DC | PRN
  Administered 2021-08-10: 200 mg via INTRAVENOUS

## 2021-08-10 MED ORDER — PROPOFOL 10 MG/ML IV BOLUS
INTRAVENOUS | Status: AC
Start: 2021-08-10 — End: ?
  Filled 2021-08-10: qty 20

## 2021-08-10 MED ORDER — DIPHENHYDRAMINE HCL 50 MG/ML IJ SOLN
INTRAMUSCULAR | Status: AC
  Filled 2021-08-10: qty 1

## 2021-08-10 MED ORDER — TRAZODONE HCL 150 MG PO TABS
200.0000 mg | ORAL_TABLET | Freq: Every day | ORAL | Status: DC
  Administered 2021-08-10: 200 mg via ORAL
  Filled 2021-08-10: qty 1

## 2021-08-10 MED ORDER — HYDROCODONE-ACETAMINOPHEN 5-325 MG PO TABS
2.0000 | ORAL_TABLET | ORAL | Status: DC | PRN
  Administered 2021-08-10 – 2021-08-11 (×4): 2 via ORAL
  Filled 2021-08-10 (×4): qty 2

## 2021-08-10 MED ORDER — ORAL CARE MOUTH RINSE: 15.0000 mL | Freq: Once | OROMUCOSAL | Status: AC

## 2021-08-10 MED ORDER — ROCURONIUM BROMIDE 10 MG/ML (PF) SYRINGE
PREFILLED_SYRINGE | INTRAVENOUS | Status: AC
  Filled 2021-08-10: qty 10

## 2021-08-10 MED ORDER — THROMBIN 5000 UNITS EX SOLR
OROMUCOSAL | Status: DC | PRN
  Administered 2021-08-10: 5 mL via TOPICAL

## 2021-08-10 MED ORDER — PHENOL 1.4 % MT LIQD: 1.0000 | OROMUCOSAL | Status: DC | PRN

## 2021-08-10 MED ORDER — MIDAZOLAM HCL 2 MG/2ML IJ SOLN
INTRAMUSCULAR | Status: DC | PRN
  Administered 2021-08-10: 2 mg via INTRAVENOUS

## 2021-08-10 MED ORDER — FENTANYL CITRATE (PF) 250 MCG/5ML IJ SOLN
INTRAMUSCULAR | Status: DC | PRN
  Administered 2021-08-10: 50 ug via INTRAVENOUS
  Administered 2021-08-10: 100 ug via INTRAVENOUS
  Administered 2021-08-10 (×2): 50 ug via INTRAVENOUS

## 2021-08-10 MED ORDER — PROPOFOL 10 MG/ML IV BOLUS
INTRAVENOUS | Status: DC | PRN
  Administered 2021-08-10: 30 mg via INTRAVENOUS
  Administered 2021-08-10: 80 mg via INTRAVENOUS

## 2021-08-10 MED ORDER — ONDANSETRON HCL 4 MG/2ML IJ SOLN
INTRAMUSCULAR | Status: AC
  Filled 2021-08-10: qty 2

## 2021-08-10 MED ORDER — ONDANSETRON HCL 4 MG PO TABS: 4.0000 mg | ORAL_TABLET | Freq: Four times a day (QID) | ORAL | Status: DC | PRN

## 2021-08-10 MED ORDER — THROMBIN 5000 UNITS EX SOLR
CUTANEOUS | Status: AC
  Filled 2021-08-10: qty 15000

## 2021-08-10 MED ORDER — FENTANYL CITRATE (PF) 250 MCG/5ML IJ SOLN
INTRAMUSCULAR | Status: AC
  Filled 2021-08-10: qty 5

## 2021-08-10 MED ORDER — HYDROMORPHONE HCL 1 MG/ML IJ SOLN: 0.2500 mg | INTRAMUSCULAR | Status: DC | PRN

## 2021-08-10 MED ORDER — HYDROMORPHONE HCL 1 MG/ML IJ SOLN: 0.5000 mg | INTRAMUSCULAR | Status: DC | PRN

## 2021-08-10 MED ORDER — OXYCODONE HCL 5 MG/5ML PO SOLN: 5.0000 mg | Freq: Once | ORAL | Status: DC | PRN

## 2021-08-10 MED ORDER — KETOROLAC TROMETHAMINE 30 MG/ML IJ SOLN
INTRAMUSCULAR | Status: AC
  Filled 2021-08-10: qty 1

## 2021-08-10 MED ORDER — DEXAMETHASONE SODIUM PHOSPHATE 10 MG/ML IJ SOLN
INTRAMUSCULAR | Status: DC | PRN
  Administered 2021-08-10: 10 mg via INTRAVENOUS

## 2021-08-10 SURGICAL SUPPLY — 64 items
BAG COUNTER SPONGE SURGICOUNT (BAG) ×2 IMPLANT
BAND RUBBER #18 3X1/16 STRL (MISCELLANEOUS) ×4 IMPLANT
BASKET BONE COLLECTION (BASKET) ×2 IMPLANT
BENZOIN TINCTURE PRP APPL 2/3 (GAUZE/BANDAGES/DRESSINGS) ×2 IMPLANT
BIT DRILL NEURO 2X3.1 SFT TUCH (MISCELLANEOUS) ×1 IMPLANT
BONE VIVIGEN FORMABLE 1.3CC (Bone Implant) ×2 IMPLANT
BUR MATCHSTICK NEURO 3.0 LAGG (BURR) ×3 IMPLANT
CANISTER SUCT 3000ML PPV (MISCELLANEOUS) ×2 IMPLANT
CARTRIDGE OIL MAESTRO DRILL (MISCELLANEOUS) ×1 IMPLANT
DERMABOND ADVANCED (GAUZE/BANDAGES/DRESSINGS) ×1
DERMABOND ADVANCED .7 DNX12 (GAUZE/BANDAGES/DRESSINGS) IMPLANT
DIFFUSER DRILL AIR PNEUMATIC (MISCELLANEOUS) ×2 IMPLANT
DRAPE C-ARM 42X72 X-RAY (DRAPES) ×4 IMPLANT
DRAPE LAPAROTOMY 100X72 PEDS (DRAPES) ×2 IMPLANT
DRAPE MICROSCOPE LEICA (MISCELLANEOUS) ×2 IMPLANT
DRILL NEURO 2X3.1 SOFT TOUCH (MISCELLANEOUS) ×2
DRSG OPSITE POSTOP 4X6 (GAUZE/BANDAGES/DRESSINGS) ×1 IMPLANT
DURAPREP 6ML APPLICATOR 50/CS (WOUND CARE) ×2 IMPLANT
ELECT COATED BLADE 2.86 ST (ELECTRODE) ×2 IMPLANT
ELECT REM PT RETURN 9FT ADLT (ELECTROSURGICAL) ×2
ELECTRODE REM PT RTRN 9FT ADLT (ELECTROSURGICAL) ×1 IMPLANT
GAUZE 4X4 16PLY ~~LOC~~+RFID DBL (SPONGE) IMPLANT
GAUZE SPONGE 4X4 12PLY STRL (GAUZE/BANDAGES/DRESSINGS) ×2 IMPLANT
GLOVE BIO SURGEON STRL SZ7 (GLOVE) ×1 IMPLANT
GLOVE BIO SURGEON STRL SZ8 (GLOVE) ×2 IMPLANT
GLOVE BIOGEL PI IND STRL 6.5 (GLOVE) IMPLANT
GLOVE BIOGEL PI IND STRL 7.0 (GLOVE) IMPLANT
GLOVE BIOGEL PI IND STRL 7.5 (GLOVE) IMPLANT
GLOVE BIOGEL PI INDICATOR 6.5 (GLOVE) ×1
GLOVE BIOGEL PI INDICATOR 7.0 (GLOVE) ×1
GLOVE BIOGEL PI INDICATOR 7.5 (GLOVE) ×1
GLOVE EXAM NITRILE XL STR (GLOVE) IMPLANT
GLOVE INDICATOR 8.5 STRL (GLOVE) ×3 IMPLANT
GLOVE SURG SS PI 6.5 STRL IVOR (GLOVE) ×3 IMPLANT
GOWN STRL REUS W/ TWL LRG LVL3 (GOWN DISPOSABLE) IMPLANT
GOWN STRL REUS W/ TWL XL LVL3 (GOWN DISPOSABLE) ×1 IMPLANT
GOWN STRL REUS W/TWL 2XL LVL3 (GOWN DISPOSABLE) IMPLANT
GOWN STRL REUS W/TWL LRG LVL3 (GOWN DISPOSABLE)
GOWN STRL REUS W/TWL XL LVL3 (GOWN DISPOSABLE) ×2
GRAFT BNE MATRIX VG FRMBL SM 1 (Bone Implant) IMPLANT
HALTER HD/CHIN CERV TRACTION D (MISCELLANEOUS) ×2 IMPLANT
HEMOSTAT POWDER KIT SURGIFOAM (HEMOSTASIS) ×2 IMPLANT
KIT BASIN OR (CUSTOM PROCEDURE TRAY) ×2 IMPLANT
KIT TURNOVER KIT B (KITS) ×2 IMPLANT
NDL SPNL 20GX3.5 QUINCKE YW (NEEDLE) ×1 IMPLANT
NEEDLE SPNL 20GX3.5 QUINCKE YW (NEEDLE) ×2 IMPLANT
NS IRRIG 1000ML POUR BTL (IV SOLUTION) ×3 IMPLANT
OIL CARTRIDGE MAESTRO DRILL (MISCELLANEOUS) ×2
PACK LAMINECTOMY NEURO (CUSTOM PROCEDURE TRAY) ×2 IMPLANT
PAD ARMBOARD 7.5X6 YLW CONV (MISCELLANEOUS) ×6 IMPLANT
PIN DISTRACTION 14MM (PIN) IMPLANT
PLATE CERV RESONATE 28 2LVL (Plate) ×1 IMPLANT
SCREW VA SD RESONATE 4.2X14 (Screw) ×6 IMPLANT
SPACER HEDRON 14X16X6 0D (Spacer) ×1 IMPLANT
SPACER HEDRON 14X16X7 0D (Spacer) ×1 IMPLANT
SPONGE INTESTINAL PEANUT (DISPOSABLE) ×2 IMPLANT
SPONGE SURGIFOAM ABS GEL SZ50 (HEMOSTASIS) ×2 IMPLANT
STRIP CLOSURE SKIN 1/2X4 (GAUZE/BANDAGES/DRESSINGS) ×2 IMPLANT
SUT VIC AB 3-0 SH 8-18 (SUTURE) ×2 IMPLANT
SUT VICRYL 4-0 PS2 18IN ABS (SUTURE) ×2 IMPLANT
TAPE CLOTH 4X10 WHT NS (GAUZE/BANDAGES/DRESSINGS) ×2 IMPLANT
TOWEL GREEN STERILE (TOWEL DISPOSABLE) ×2 IMPLANT
TOWEL GREEN STERILE FF (TOWEL DISPOSABLE) ×2 IMPLANT
WATER STERILE IRR 1000ML POUR (IV SOLUTION) ×2 IMPLANT

## 2021-08-10 NOTE — Progress Notes (Signed)
Swelling noted to right side of shoulder area OR staff reports Dr. Wynetta Emery aware

## 2021-08-10 NOTE — Anesthesia Procedure Notes (Signed)
Procedure Name: Intubation Date/Time: 08/10/2021 10:12 AM  Performed by: Jodell Cipro, CRNAPre-anesthesia Checklist: Patient identified, Emergency Drugs available, Suction available and Patient being monitored Patient Re-evaluated:Patient Re-evaluated prior to induction Oxygen Delivery Method: Circle System Utilized Preoxygenation: Pre-oxygenation with 100% oxygen Induction Type: IV induction Ventilation: Mask ventilation without difficulty Laryngoscope Size: Glidescope and 3 Grade View: Grade I Tube type: Oral Tube size: 7.0 mm Number of attempts: 1 Airway Equipment and Method: Stylet and Oral airway Placement Confirmation: ETT inserted through vocal cords under direct vision, positive ETCO2 and breath sounds checked- equal and bilateral Secured at: 21 cm Tube secured with: Tape Dental Injury: Teeth and Oropharynx as per pre-operative assessment  Difficulty Due To: Difficult Airway- due to reduced neck mobility and Difficulty was anticipated

## 2021-08-10 NOTE — Op Note (Signed)
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  Work-up revealed severe cervical spondylosis with multiple levels but worst in with cord compression and foraminal stenosis with C5-6 and C6-7.  Due to patient progression of clinical syndrome imaging findings of a conservative treatment I recommended anterior cervical discectomies and fusions at these 2 levels.  I extensively went over the risks and benefits of the operation with her as well as perioperative course expectations of outcome and alternatives to surgery and she understood and agreed to proceed forward.  Operative procedure: Patient was brought into the OR was induced under general anesthesia positioned supine the neck in slight extension 5 pounds halter traction.  The right 7 neck was prepped and draped in routine sterile fashion.  Preoperative x-ray localized the appropriate level.  A curvilinear incision was made just off midline to the anterior border of the sternocleidomastoid and the superficial platysma was dissected out divided longitudinally.  The avascular plane between the sternomastoid and strap muscle was developed down to the prevertebral fascia and the prevertebral fascia was dissected away with Kitners.  Preoperative x-ray confirmed identification appropriate level.  Annulotomy's were made with a 15 blade scalpel to mark the disc base longus goes reflected laterally and self-retaining  retractors were placed.  Anterior osteophytes were bitten off with a 2 and 3 Kerrison punch.  There was a large left-sided osteophytes these were all removed and both disc bases were drilled down capturing the bone shavings and mucus trap.  Under microscopic illumination first working at C5-6 disc base was drilled down noted markedly collapsed I drilled down the posterior annulus and osteophytic complex.  Aggressive under biting both endplates allowed defecation posterior longitudinal ligament which was removed in piecemeal fashion.  I aggressively under Bitton both endplates decompressing central canal and decompressing both C6 nerve root skeletonizing and flush with the pedicle.  At the end discectomy there is no further stenosis either centrally or foraminally this was packed with Gelfoam tension taken to C6-7 in a similar fashion C6-7 was drilled down pathology here was primarily left-sided spur whereas at C5-6 with predominantly right-sided body in a similar fashion I aggressively under Bitton both endplates remove the posterior large ligament decompress central canal and skeletonized both C7 nerve roots flush with pedicle.  After adequate decompression been achieved I sized up a 7 mm cage for C5-6 6 mm for C6-7 both cages were packed with locally harvested autograft mixed with Vivigen and inserted.  Then 28 mm globus extend resonate plate was selected all screws had excellent purchase and postop fluoroscopy confirmed good position of the implants locking mechanisms were engaged.  Wounds copiously again meticulous hemostasis was maintained some additional bone graft been packed underneath the plate and laterally to the cages and then the wound was closed in layers with interrupted Vicryl and platysma and a running 4 subcuticular.  Dermabond benzoin Steri-Strips and a sterile dressing was applied patient recovery in stable condition.  At the end the case all needle count sponge counts were correct.

## 2021-08-10 NOTE — H&P (Signed)
Brenda Nelson is an 66 y.o. female.   Chief Complaint: Neck and right greater than left arm pain HPI: 66 year old female with right shoulder and arm pain rating down C6 and C7 nerve root pattern.  Work-up revealed severe cervical spondylosis worst of which at C5-6 and C6-7.  Due to patient's failure of conservative treatment imaging findings and progression of clinical syndrome I recommended an ACDF at those 2 levels.  I extensively went over the risks and benefits of that operation with her as well as perioperative course expectations of outcome and alternatives of surgery and she understands and agrees to proceed forward.  Past Medical History:  Diagnosis Date   Bipolar disorder [facility])     Past Surgical History:  Procedure Laterality Date   ABDOMINAL HYSTERECTOMY     APPENDECTOMY     HERNIA REPAIR Left    inguinal   TONSILLECTOMY      History reviewed. No pertinent family history. Social History:  reports that she has never smoked. She has never used smokeless tobacco. She reports that she does not drink alcohol and does not use drugs.  Allergies:  Allergies  Allergen Reactions   Lamotrigine Swelling    Lip swelling    Medications Prior to Admission  Medication Sig Dispense Refill   Calcium Carb-Cholecalciferol 500-10 MG-MCG TABS Take 1 tablet by mouth daily.     tiZANidine (ZANAFLEX) 4 MG tablet Take 4 mg by mouth every 6 (six) hours as needed for muscle spasms.     traZODone (DESYREL) 100 MG tablet Take 200 mg by mouth at bedtime.     ziprasidone (GEODON) 60 MG capsule Take 60 mg by mouth daily with supper.     predniSONE (DELTASONE) 10 MG tablet Take 3 tablets (30 mg total) by mouth daily. (Patient not taking: Reported on 07/27/2021) 15 tablet 0    Results for orders placed or performed during the hospital encounter of 08/10/21 (from the past 48 hour(s))  ABO/Rh     Status: None   Collection Time: 08/10/21  7:55 AM  Result Value Ref Range   ABO/RH(D)      A  POS Performed at [facility], [address], [place], [place] [address]    No results found.  Review of Systems  Musculoskeletal:  Positive for neck pain.  Neurological:  Positive for numbness.    Blood pressure (!) 146/59, pulse 64, temperature 98.8 F (37.1 C), resp. rate 18, height 5\' 5"  (1.651 m), weight 84.8 kg, SpO2 94 %. Physical Exam HENT:     Head: Normocephalic.     Nose: Nose normal.  Eyes:     Pupils: Pupils are equal, round, and reactive to light.  Cardiovascular:     Rate and Rhythm: Normal rate.  Abdominal:     General: Abdomen is flat.  Musculoskeletal:        General: Normal range of motion.     Cervical back: Normal range of motion.  Skin:    General: Skin is warm.  Neurological:     Mental Status: She is alert.     Comments: Strength is deltoid, bicep, tricep, wrist flexion, wrist extension, hand intrinsics.      Assessment/Plan 66 year old presents for ACDF C5-6 C6-7  73, MD 08/10/2021, 9:51 AM

## 2021-08-10 NOTE — Transfer of Care (Signed)
Immediate Anesthesia Transfer of Care Note  Patient: Brenda Nelson  Procedure(s) Performed: Anterior Cervical Decompression Fusion  Cervical five-Cervical six - Cerival six-Cervical seven  Patient Location: PACU  Anesthesia Type:General  Level of Consciousness: drowsy and patient cooperative  Airway & Oxygen Therapy: Patient Spontanous Breathing and Patient connected to face mask oxygen  Post-op Assessment: Report given to RN, Post -op Vital signs reviewed and stable and Patient moving all extremities  Post vital signs: Reviewed and stable  Last Vitals:  Vitals Value Taken Time  BP 134/71 08/10/21 1245  Temp    Pulse 66 08/10/21 1247  Resp 11 08/10/21 1247  SpO2 92 % 08/10/21 1247  Vitals shown include unvalidated device data.  Last Pain:  Vitals:   08/10/21 0811  PainSc: 0-No pain         Complications:  Encounter Notable Events  Notable Event Outcome Phase Comment  Difficult to intubate - expected  Intraprocedure Filed from anesthesia note documentation.

## 2021-08-10 NOTE — Anesthesia Postprocedure Evaluation (Signed)
Anesthesia Post Note  Patient: Brenda Nelson  Procedure(s) Performed: Anterior Cervical Decompression Fusion  Cervical five-Cervical six - Cerival six-Cervical seven     Patient location during evaluation: PACU Anesthesia Type: General Level of consciousness: awake and alert Pain management: pain level controlled Vital Signs Assessment: post-procedure vital signs reviewed and stable Respiratory status: spontaneous breathing, nonlabored ventilation, respiratory function stable and patient connected to nasal cannula oxygen Cardiovascular status: blood pressure returned to baseline and stable Postop Assessment: no apparent nausea or vomiting Anesthetic complications: yes   Encounter Notable Events  Notable Event Outcome Phase Comment  Difficult to intubate - expected  Intraprocedure Filed from anesthesia note documentation.    Last Vitals:  Vitals:   08/10/21 1315 08/10/21 1349  BP: 127/62 (!) 116/59  Pulse: 69 64  Resp: 14 18  Temp: 36.5 C 36.6 C  SpO2: 94% 96%    Last Pain:  Vitals:   08/10/21 1349  PainSc: 0-No pain                 Randeep Biondolillo

## 2021-08-11 ENCOUNTER — Encounter (HOSPITAL_COMMUNITY): Payer: Self-pay | Admitting: Neurosurgery

## 2021-08-11 DIAGNOSIS — M4722 Other spondylosis with radiculopathy, cervical region: Secondary | ICD-10-CM | POA: Diagnosis not present

## 2021-08-11 MED ORDER — HYDROCODONE-ACETAMINOPHEN 5-325 MG PO TABS: 2.0000 | ORAL_TABLET | ORAL | 0 refills | Status: DC | PRN

## 2021-08-11 MED ORDER — CYCLOBENZAPRINE HCL 10 MG PO TABS
10.0000 mg | ORAL_TABLET | Freq: Three times a day (TID) | ORAL | 0 refills | Status: DC | PRN
Start: 2021-08-11 — End: 2023-12-27

## 2021-08-11 NOTE — Progress Notes (Signed)
PT Cancellation Note and Discharge  Patient Details Name: Brenda Nelson MRN: [id number] DOB: Jan 11, 1956   Cancelled Treatment:    Reason Eval/Treat Not Completed: PT screened, no needs identified, will sign off. Discussed pt case with OT who reports pt is currently functioning at a mod I level and does not require a formal PT eval at this time. Will sign off. If needs change please reconsult.    Marylynn Pearson 08/11/2021, 10:44 AM  Conni Slipper, PT, DPT Acute Rehabilitation Services Secure Chat Preferred Office: [phone]

## 2021-08-11 NOTE — Evaluation (Signed)
Occupational Therapy Evaluation Patient Details Name: Brenda Nelson MRN: [id number] DOB: 1955/10/21 Today's Date: 08/11/2021   History of Present Illness  66 yo female s/p ACDF C5-7 on 6/21. PMH including hernia repair, bipolar, and abdominal hysterectomy.    Clinical Impression   PTA, pt was living with her husband and was independent. Currently, pt performing at Mod I level for ADLs and functional mobility with increased time for adherence to precautions. Provided education and handout on back precautions, grooming, UB ADLs, LB ADLs, toileting, stair management, and tub transfer; pt demonstrated understanding. Answered all pt questions. Recommend dc home once medically stable per physician. All acute OT needs met and will sign off. Thank you.      Recommendations for follow up therapy are one component of a multi-disciplinary discharge planning process, led by the attending physician.  Recommendations may be updated based on patient status, additional functional criteria and insurance authorization.   Follow Up Recommendations  No OT follow up    Assistance Recommended at Discharge Intermittent Supervision/Assistance  Patient can return home with the following      Functional Status Assessment  Patient has had a recent decline in their functional status and demonstrates the ability to make significant improvements in function in a reasonable and predictable amount of time.  Equipment Recommendations  None recommended by OT    Recommendations for Other Services       Precautions / Restrictions Precautions Precautions: Cervical Precaution Booklet Issued: Yes (comment) Required Braces or Orthoses: Cervical Brace Cervical Brace: Soft collar Restrictions Weight Bearing Restrictions: No      Mobility Bed Mobility Overal bed mobility: Modified Independent             General bed mobility comments: log roll with increased time. Use of HOB as pt has adjustable bed at home     Transfers Overall transfer level: Modified independent                        Balance Overall balance assessment: Needs assistance Sitting-balance support: No upper extremity supported, Feet supported Sitting balance-Leahy Scale: Good     Standing balance support: No upper extremity supported, During functional activity Standing balance-Leahy Scale: Good                             ADL either performed or assessed with clinical judgement   ADL Overall ADL's : Modified independent                                       General ADL Comments: Providing education for cervical precautions, bed mobility, grooming, UB ADLs, LB ADLs, toileting, tub transfer, and stair management.     Vision         Perception     Praxis      Pertinent Vitals/Pain Pain Assessment Pain Assessment: Faces Faces Pain Scale: Hurts little more Pain Location: Neck Pain Descriptors / Indicators: Sore Pain Intervention(s): Monitored during session, Repositioned, Ice applied     Hand Dominance Right   Extremity/Trunk Assessment Upper Extremity Assessment Upper Extremity Assessment: Overall WFL for tasks assessed   Lower Extremity Assessment Lower Extremity Assessment: Overall WFL for tasks assessed   Cervical / Trunk Assessment Cervical / Trunk Assessment: Neck Surgery   Communication Communication Communication: No difficulties   Cognition Arousal/Alertness: Awake/alert Behavior During Therapy:  WFL for tasks assessed/performed Overall Cognitive Status: Within Functional Limits for tasks assessed                                       General Comments  Husband present throughout    Exercises     Shoulder Instructions      Home Living Family/patient expects to be discharged to:: Private residence Living Arrangements: Spouse/significant other Available Help at Discharge: Family;Available 24 hours/day Type of Home: House Home  Access: Stairs to enter [employer] of Steps: 3 Entrance Stairs-Rails: None Home Layout: One level     Bathroom Shower/Tub: Teacher, early years/pre: Standard     Home Equipment: Conservation officer, nature (2 wheels);Cane - single point (adjustable bed)          Prior Functioning/Environment Prior Level of Function : Independent/Modified Independent                        OT Problem List: Decreased range of motion;Decreased knowledge of precautions      OT Treatment/Interventions:      OT Goals(Current goals can be found in the care plan section) Acute Rehab OT Goals Patient Stated Goal: Go home OT Goal Formulation: All assessment and education complete, DC therapy  OT Frequency:      Co-evaluation              AM-PAC OT "6 Clicks" Daily Activity     Outcome Measure Help from another person eating meals?: None Help from another person taking care of personal grooming?: None Help from another person toileting, which includes using toliet, bedpan, or urinal?: None Help from another person bathing (including washing, rinsing, drying)?: None Help from another person to put on and taking off regular upper body clothing?: None Help from another person to put on and taking off regular lower body clothing?: None 6 Click Score: 24   End of Session Equipment Utilized During Treatment: Cervical collar Nurse Communication: Mobility status  Activity Tolerance: Patient tolerated treatment well Patient left: with call bell/phone within reach;with family/visitor present;in bed  OT Visit Diagnosis: Muscle weakness (generalized) (M62.81);Pain Pain - part of body:  (Neck)                Time: 1660-6301 OT Time Calculation (min): 22 min Charges:  OT General Charges $OT Visit: 1 Visit OT Evaluation $OT Eval Low Complexity: 1 Low    MSOT, OTR/L Acute Rehab Office: [place] 08/11/2021, 9:52 AM

## 2021-08-11 NOTE — Plan of Care (Signed)

## 2021-08-11 NOTE — Discharge Instructions (Signed)
Wound Care  Keep the incision clean and dry remove the outer dressing in 2 days, leave the Steri-Strips intact. Wrap with Saran wrap for showers only Do not put any creams, lotions, or ointments on incision. Leave steri-strips on neck.  They will fall off by themselves.  Activity Walk each and every day, increasing distance each day. No lifting greater than 5 lbs.  Avoid excessive neck motion. No lifting no bending no twisting no driving or riding a car unless coming back and forth to see me. Wear neck brace at all times except when showering.   Diet Resume your normal diet.   Call Your Doctor If Any of These Occur Redness, drainage, or swelling at the wound.  Temperature greater than 101 degrees. Severe pain not relieved by pain medication. Incision starts to come apart.  Follow Up Appt Call today for appointment in 1-2 weeks ([phone]) or for problems.  If you have any hardware placed in your spine, you will need an x-ray before your appointment.   

## 2021-08-11 NOTE — Progress Notes (Signed)
Patient awaiting transport to her vehicle via wheelchair by volunteer for discharge home; in no acute distress nor complaints of pain nor discomfort; moves all extremities well; incision on her anterior neck with gauze dressing and is clean, dry and intact with soft collar on; room was checked for all her belongings; discharge instructions concerning her medications, incision care, follow up appointment and when to call the doctor as needed were all discussed with patient and her husband by RN and both expressed understanding on the instructions given.

## 2021-09-27 ENCOUNTER — Other Ambulatory Visit: Payer: Self-pay | Admitting: Student

## 2021-09-27 DIAGNOSIS — M542 Cervicalgia: Secondary | ICD-10-CM

## 2021-10-03 ENCOUNTER — Ambulatory Visit
Admission: RE | Admit: 2021-10-03 | Discharge: 2021-10-03 | Disposition: A | Discharge status: Home / Self Care | Payer: Medicare Other | Source: Ambulatory Visit | Attending: Student | Admitting: Student

## 2021-10-03 DIAGNOSIS — M542 Cervicalgia: Secondary | ICD-10-CM

## 2021-12-27 ENCOUNTER — Ambulatory Visit: Payer: Medicare Other | Admitting: Physician Assistant

## 2022-01-04 ENCOUNTER — Ambulatory Visit: Payer: Medicare Other | Admitting: Physician Assistant

## 2022-05-18 ENCOUNTER — Other Ambulatory Visit: Payer: Self-pay | Admitting: Internal Medicine

## 2022-05-19 LAB — COMPLETE METABOLIC PANEL WITH GFR
AG Ratio: 1.7 (calc) (ref 1.0–2.5)
ALT: 20 U/L (ref 6–29)
AST: 14 U/L (ref 10–35)
Albumin: 4 g/dL (ref 3.6–5.1)
Alkaline phosphatase (APISO): 94 U/L (ref 37–153)
BUN: 11 mg/dL (ref 7–25)
CO2: 24 mmol/L (ref 20–32)
Calcium: 9.2 mg/dL (ref 8.6–10.4)
Chloride: 106 mmol/L (ref 98–110)
Creat: 1.04 mg/dL (ref 0.50–1.05)
Globulin: 2.4 g/dL (calc) (ref 1.9–3.7)
Glucose, Bld: 83 mg/dL (ref 65–99)
Potassium: 4.2 mmol/L (ref 3.5–5.3)
Sodium: 142 mmol/L (ref 135–146)
Total Bilirubin: 0.3 mg/dL (ref 0.2–1.2)
Total Protein: 6.4 g/dL (ref 6.1–8.1)
eGFR: 58 mL/min/{1.73_m2} — ABNORMAL LOW (ref >=60)

## 2022-05-19 LAB — LIPID PANEL
Cholesterol: 251 mg/dL — ABNORMAL HIGH (ref <200)
HDL: 52 mg/dL (ref >=50)
LDL Cholesterol (Calc): 172 mg/dL (calc) — ABNORMAL HIGH
Non-HDL Cholesterol (Calc): 199 mg/dL (calc) — ABNORMAL HIGH (ref <130)
Total CHOL/HDL Ratio: 4.8 (calc) (ref <5.0)
Triglycerides: 135 mg/dL (ref <150)

## 2022-05-19 LAB — TSH: TSH: 1.13 mIU/L (ref 0.40–4.50)

## 2022-05-19 LAB — CBC
HCT: 40.5 % (ref 35.0–45.0)
Hemoglobin: 13 g/dL (ref 11.7–15.5)
MCH: 27.4 pg (ref 27.0–33.0)
MCHC: 32.1 g/dL (ref 32.0–36.0)
MCV: 85.4 fL (ref 80.0–100.0)
MPV: 9.6 fL (ref 7.5–12.5)
Platelets: 355 10*3/uL (ref 140–400)
RBC: 4.74 10*6/uL (ref 3.80–5.10)
RDW: 13 % (ref 11.0–15.0)
WBC: 4.9 10*3/uL (ref 3.8–10.8)

## 2022-05-19 LAB — VITAMIN D 25 HYDROXY (VIT D DEFICIENCY, FRACTURES): Vit D, 25-Hydroxy: 50 ng/mL (ref 30–100)

## 2023-01-08 ENCOUNTER — Ambulatory Visit (HOSPITAL_COMMUNITY)
Admission: EM | Admit: 2023-01-08 | Discharge: 2023-01-08 | Disposition: A | Discharge status: Home / Self Care | Payer: Medicare Other | Attending: Emergency Medicine | Admitting: Emergency Medicine

## 2023-01-08 ENCOUNTER — Telehealth (HOSPITAL_COMMUNITY): Payer: Self-pay | Admitting: Emergency Medicine

## 2023-01-08 ENCOUNTER — Ambulatory Visit (INDEPENDENT_AMBULATORY_CARE_PROVIDER_SITE_OTHER): Payer: Medicare Other

## 2023-01-08 ENCOUNTER — Other Ambulatory Visit: Payer: Self-pay

## 2023-01-08 ENCOUNTER — Encounter (HOSPITAL_COMMUNITY): Payer: Self-pay | Admitting: Emergency Medicine

## 2023-01-08 DIAGNOSIS — R051 Acute cough: Secondary | ICD-10-CM

## 2023-01-08 DIAGNOSIS — J22 Unspecified acute lower respiratory infection: Secondary | ICD-10-CM | POA: Diagnosis not present

## 2023-01-08 MED ORDER — ALBUTEROL SULFATE HFA 108 (90 BASE) MCG/ACT IN AERS: 1.0000 | INHALATION_SPRAY | Freq: Four times a day (QID) | RESPIRATORY_TRACT | 0 refills | Status: DC | PRN

## 2023-01-08 MED ORDER — ACETAMINOPHEN 325 MG PO TABS
ORAL_TABLET | ORAL | Status: AC
  Filled 2023-01-08: qty 2

## 2023-01-08 MED ORDER — PREDNISONE 20 MG PO TABS: 40.0000 mg | ORAL_TABLET | Freq: Every day | ORAL | 0 refills | Status: AC

## 2023-01-08 MED ORDER — PROMETHAZINE-DM 6.25-15 MG/5ML PO SYRP: 5.0000 mL | ORAL_SOLUTION | Freq: Four times a day (QID) | ORAL | 0 refills | Status: DC | PRN

## 2023-01-08 MED ORDER — AMOXICILLIN-POT CLAVULANATE 875-125 MG PO TABS: 1.0000 | ORAL_TABLET | Freq: Two times a day (BID) | ORAL | 0 refills | Status: DC

## 2023-01-08 MED ORDER — ACETAMINOPHEN 325 MG PO TABS
650.0000 mg | ORAL_TABLET | Freq: Once | ORAL | Status: AC
  Administered 2023-01-08: 650 mg via ORAL

## 2023-01-08 NOTE — Discharge Instructions (Signed)
Use the cough medicine sparingly at night to help with cough suppression.  Throughout the day continue with your symptomatic at home remedies.  Start the steroids today and then take them daily in the morning with breakfast.  You can use the inhaler every 6 hours as needed for any wheezing or shortness of breath.  Please sleep with a humidifier and ensure you are drinking plenty of fluids.  I we will contact you if the radiology over read is different than mine and initiate the appropriate treatment.  Return to clinic or follow-up with your primary care if no improvement in your symptoms over the next 5 to 7 days, or you develop any changes in your symptoms.

## 2023-01-08 NOTE — ED Triage Notes (Signed)
Cough and congestion started Friday.  Reports a runny nose, diarrhea.  No episodes of diarrhea today.    Has taken alka seltzer cold plus.

## 2023-01-08 NOTE — Telephone Encounter (Signed)
Attempted to contact patient via all 3 phone numbers in chart, no answer and unable to leave voicemail.  Radiology over read of her chest x-ray may show early pneumonia, will cover with Augmentin.  Medication sent into preferred pharmacy.

## 2023-01-08 NOTE — ED Provider Notes (Signed)
[facility]    CSN: [id number] Arrival date & time: 01/08/23  1033      History   Chief Complaint Chief Complaint  Patient presents with   Cough    HPI Brenda Nelson is a 67 y.o. female.   Patient presents to clinic complaining of a cough that started Friday.  Her mucus started Saturday.  She does endorse wheezing and shortness of breath with coughing.  She also had diarrhea on Saturday, this lasted into Sunday and has since resolved.  Her great grandchild was sick recently.  She has been taking Alka-Seltzer, tea with lemon and honey and other over-the-counter at home remedies without much relief.  Denies fevers.  No nausea or vomiting.  She does not have a history of asthma or respiratory issues.  The history is provided by the patient and medical records.  Cough   Past Medical History:  Diagnosis Date   Bipolar disorder [facility])     Patient Active Problem List   Diagnosis Date Noted   Spinal stenosis of cervical region 08/10/2021    Past Surgical History:  Procedure Laterality Date   ABDOMINAL HYSTERECTOMY     ANTERIOR CERVICAL DECOMP/DISCECTOMY FUSION N/A 08/10/2021   Procedure: Anterior Cervical Decompression Fusion  Cervical five-Cervical six - Cerival six-Cervical seven;  Surgeon: Donalee Citrin, MD;  Location: [facility] OR;  Service: Neurosurgery;  Laterality: N/A;   APPENDECTOMY     HERNIA REPAIR Left    inguinal   TONSILLECTOMY      OB History   No obstetric history on file.      Home Medications    Prior to Admission medications   Medication Sig Start Date End Date Taking? Authorizing Provider  albuterol (VENTOLIN HFA) 108 (90 Base) MCG/ACT inhaler Inhale 1-2 puffs into the lungs every 6 (six) hours as needed for wheezing or shortness of breath. 01/08/23  Yes Rinaldo Ratel, [place] N, FNP  predniSONE (DELTASONE) 20 MG tablet Take 2 tablets (40 mg total) by mouth daily for 5 days. 01/08/23 01/13/23 Yes Rinaldo Ratel, [place] N, FNP   promethazine-dextromethorphan (PROMETHAZINE-DM) 6.25-15 MG/5ML syrup Take 5 mLs by mouth 4 (four) times daily as needed for cough. 01/08/23  Yes Rinaldo Ratel, [place] N, FNP  Calcium Carb-Cholecalciferol 500-10 MG-MCG TABS Take 1 tablet by mouth daily. 03/01/21   [provider]  cyclobenzaprine (FLEXERIL) 10 MG tablet Take 1 tablet (10 mg total) by mouth 3 (three) times daily as needed for muscle spasms. 08/11/21   Donalee Citrin, MD  HYDROcodone-acetaminophen (NORCO/VICODIN) 5-325 MG tablet Take 2 tablets by mouth every 4 (four) hours as needed for severe pain ((score 7 to 10)). 08/11/21   Donalee Citrin, MD  tiZANidine (ZANAFLEX) 4 MG tablet Take 4 mg by mouth every 6 (six) hours as needed for muscle spasms.    [provider]  traZODone (DESYREL) 100 MG tablet Take 200 mg by mouth at bedtime. 04/21/21   [provider]  ziprasidone (GEODON) 60 MG capsule Take 60 mg by mouth daily with supper. 04/29/21   [provider]    Family History History reviewed. No pertinent family history.  Social History Social History   Tobacco Use   Smoking status: Never   Smokeless tobacco: Never  Vaping Use   Vaping status: Never Used  Substance Use Topics   Alcohol use: No   Drug use: No     Allergies   Lamotrigine   Review of Systems Review of Systems  Per HPI   Physical Exam Triage Vital  Signs ED Triage Vitals  Encounter Vitals Group     BP 01/08/23 1249 (!) 146/84     Systolic BP Percentile --      Diastolic BP Percentile --      Pulse Rate 01/08/23 1249 61     Resp 01/08/23 1249 20     Temp 01/08/23 1249 99.1 F (37.3 C)     Temp Source 01/08/23 1249 Oral     SpO2 01/08/23 1249 94 %     Weight --      Height --      Head Circumference --      Peak Flow --      Pain Score 01/08/23 1247 0     Pain Loc --      Pain Education --      Exclude from Growth Chart --    No data found.  Updated Vital Signs BP (!) 146/84 (BP Location: Right Arm)   Pulse 61    Temp 99.1 F (37.3 C) (Oral)   Resp 20   SpO2 94%   Visual Acuity Right Eye Distance:   Left Eye Distance:   Bilateral Distance:    Right Eye Near:   Left Eye Near:    Bilateral Near:     Physical Exam Vitals and nursing note reviewed.  Constitutional:      Appearance: Normal appearance.  HENT:     Head: Normocephalic and atraumatic.     Right Ear: External ear normal.     Left Ear: External ear normal.     Nose: Congestion and rhinorrhea present.     Mouth/Throat:     Mouth: Mucous membranes are moist.     Pharynx: Posterior oropharyngeal erythema present.  Eyes:     Conjunctiva/sclera: Conjunctivae normal.  Cardiovascular:     Rate and Rhythm: Normal rate and regular rhythm.     Heart sounds: Normal heart sounds. No murmur heard. Pulmonary:     Effort: Pulmonary effort is normal. No respiratory distress.     Breath sounds: Normal breath sounds.  Musculoskeletal:        General: Normal range of motion.  Skin:    General: Skin is warm and dry.  Neurological:     General: No focal deficit present.     Mental Status: She is alert and oriented to person, place, and time.  Psychiatric:        Mood and Affect: Mood normal.        Behavior: Behavior normal.      UC Treatments / Results  Labs (all labs ordered are listed, but only abnormal results are displayed) Labs Reviewed - No data to display  EKG   Radiology DG Chest 2 View  Result Date: 01/08/2023 CLINICAL DATA:  Shortness of breath.  Wheezing wheezing.  Cough. EXAM: CHEST - 2 VIEW COMPARISON:  Chest radiographs 05/05/2021 and 11/27/2015 FINDINGS: Cardiac silhouette is again mildly enlarged. Mild-to-moderate atherosclerotic calcification within the aortic arch. Mild blunting of the left costophrenic angle with minimal thickening of the inferior lateral left pleura appears new from most recent 05/05/2021 frontal radiograph. This may represent a small pleural effusion. Associated minimal left posterior  basilar heterogeneous airspace opacity. The right lung is clear. No pneumothorax. New partially visualized lower cervical spine ACDF hardware. IMPRESSION: Possible new small pleural effusion. Associated minimal left posterior basilar heterogeneous airspace opacity may represent atelectasis or early pneumonia. Electronically Signed   By: Neita Garnet M.D.   On: 01/08/2023 16:10  Procedures Procedures (including critical care time)  Medications Ordered in UC Medications  acetaminophen (TYLENOL) tablet 650 mg (650 mg Oral Given 01/08/23 1318)    Initial Impression / Assessment and Plan / UC Course  I have reviewed the triage vital signs and the nursing notes.  Pertinent labs & imaging results that were available during my care of the patient were reviewed by me and considered in my medical decision making (see chart for details).  Vitals and triage reviewed, patient is hemodynamically stable.  Lungs are vesicular, heart with regular rate and rhythm.  Oxygenation on recheck is 97% on room air.  Expiratory wheezing with coughing spells noted.  Imaging does not reveal any obvious infiltrate, radiology overread is pending. Over-read with minimal left posterior basilar heterogeneous airspace opacity may represent atelectasis or early pneumonia, sent in Augmentin.  Attempted to contact patient via all 3 numbers in chart, no answers and unable to leave voicemails.  Symptomatic management for cough, wheezing and shortness of breath discussed.  Strict return and follow-up precautions given.  Patient verbalized understanding, no questions at this time.    Final Clinical Impressions(s) / UC Diagnoses   Final diagnoses:  Upper respiratory tract infection, unspecified type  Acute cough     Discharge Instructions      Use the cough medicine sparingly at night to help with cough suppression.  Throughout the day continue with your symptomatic at home remedies.  Start the steroids today and then take  them daily in the morning with breakfast.  You can use the inhaler every 6 hours as needed for any wheezing or shortness of breath.  Please sleep with a humidifier and ensure you are drinking plenty of fluids.  I we will contact you if the radiology over read is different than mine and initiate the appropriate treatment.  Return to clinic or follow-up with your primary care if no improvement in your symptoms over the next 5 to 7 days, or you develop any changes in your symptoms.      ED Prescriptions     Medication Sig Dispense Auth. Provider   promethazine-dextromethorphan (PROMETHAZINE-DM) 6.25-15 MG/5ML syrup Take 5 mLs by mouth 4 (four) times daily as needed for cough. 118 mL Rinaldo Ratel, [place] N, FNP   albuterol (VENTOLIN HFA) 108 (90 Base) MCG/ACT inhaler Inhale 1-2 puffs into the lungs every 6 (six) hours as needed for wheezing or shortness of breath. 18 g Rinaldo Ratel, [place] N, [place]   predniSONE (DELTASONE) 20 MG tablet Take 2 tablets (40 mg total) by mouth daily for 5 days. 10 tablet Mylen Mangan, [place] N, FNP      PDMP not reviewed this encounter.   Joua Bake, [place] N, [place] 01/08/23 [id number]

## 2023-06-20 LAB — HM MAMMOGRAPHY

## 2023-12-21 ENCOUNTER — Telehealth: Payer: Self-pay | Admitting: *Deleted

## 2023-12-21 NOTE — Telephone Encounter (Signed)
 Ok to schedule a TOC appt

## 2023-12-21 NOTE — Telephone Encounter (Signed)
 That is fine

## 2023-12-21 NOTE — Telephone Encounter (Signed)
 Copied from CRM [id number]. Topic: Appointments - Scheduling Inquiry for Clinic >> Dec 18, 2023  3:35 PM Alexandria E wrote: Reason for CRM: Patient would like to get established with Dr. Kennyth, relayed to patient that he is currently not accepting new patients, but patient stated how her husband is already established with him, so questioning if Dr. Kennyth would be willing to take her on as well. Please call patient and advise if this is doable.   Please advise  Hades Mathew,RMA

## 2023-12-24 ENCOUNTER — Telehealth: Payer: Self-pay | Admitting: *Deleted

## 2023-12-24 NOTE — Telephone Encounter (Signed)
 SABRA

## 2023-12-27 ENCOUNTER — Ambulatory Visit (INDEPENDENT_AMBULATORY_CARE_PROVIDER_SITE_OTHER): Admitting: Family Medicine

## 2023-12-27 ENCOUNTER — Encounter: Payer: Self-pay | Admitting: Family Medicine

## 2023-12-27 VITALS — BP 110/70 | HR 69 | Temp 97.8°F | Ht 65.0 in | Wt 179.0 lb

## 2023-12-27 DIAGNOSIS — F319 Bipolar disorder, unspecified: Secondary | ICD-10-CM | POA: Diagnosis not present

## 2023-12-27 DIAGNOSIS — R252 Cramp and spasm: Secondary | ICD-10-CM

## 2023-12-27 DIAGNOSIS — Z13220 Encounter for screening for lipoid disorders: Secondary | ICD-10-CM

## 2023-12-27 DIAGNOSIS — Z131 Encounter for screening for diabetes mellitus: Secondary | ICD-10-CM | POA: Diagnosis not present

## 2023-12-27 DIAGNOSIS — E2839 Other primary ovarian failure: Secondary | ICD-10-CM | POA: Diagnosis not present

## 2023-12-27 DIAGNOSIS — L821 Other seborrheic keratosis: Secondary | ICD-10-CM | POA: Insufficient documentation

## 2023-12-27 LAB — TSH: TSH: 1.45 u[IU]/mL (ref 0.35–5.50)

## 2023-12-27 LAB — COMPREHENSIVE METABOLIC PANEL WITH GFR
ALT: 23 U/L (ref 0–35)
AST: 18 U/L (ref 0–37)
Albumin: 4.1 g/dL (ref 3.5–5.2)
Alkaline Phosphatase: 99 U/L (ref 39–117)
BUN: 12 mg/dL (ref 6–23)
CO2: 28 meq/L (ref 19–32)
Calcium: 9.1 mg/dL (ref 8.4–10.5)
Chloride: 107 meq/L (ref 96–112)
Creatinine, Ser: 1.09 mg/dL (ref 0.40–1.20)
GFR: 51.41 mL/min — ABNORMAL LOW (ref >60.00)
Glucose, Bld: 74 mg/dL (ref 70–99)
Potassium: 3.8 meq/L (ref 3.5–5.1)
Sodium: 141 meq/L (ref 135–145)
Total Bilirubin: 0.3 mg/dL (ref 0.2–1.2)
Total Protein: 6.6 g/dL (ref 6.0–8.3)

## 2023-12-27 LAB — LIPID PANEL
Cholesterol: 279 mg/dL — ABNORMAL HIGH (ref 0–200)
HDL: 57.3 mg/dL (ref >39.00)
LDL Cholesterol: 196 mg/dL — ABNORMAL HIGH (ref 0–99)
NonHDL: 222.04
Total CHOL/HDL Ratio: 5
Triglycerides: 128 mg/dL (ref 0.0–149.0)
VLDL: 25.6 mg/dL (ref 0.0–40.0)

## 2023-12-27 LAB — VITAMIN B12: Vitamin B-12: 236 pg/mL (ref 211–911)

## 2023-12-27 LAB — CBC
HCT: 40.3 % (ref 36.0–46.0)
Hemoglobin: 13.1 g/dL (ref 12.0–15.0)
MCHC: 32.6 g/dL (ref 30.0–36.0)
MCV: 86.2 fl (ref 78.0–100.0)
Platelets: 250 K/uL (ref 150.0–400.0)
RBC: 4.68 Mil/uL (ref 3.87–5.11)
RDW: 14 % (ref 11.5–15.5)
WBC: 5.8 K/uL (ref 4.0–10.5)

## 2023-12-27 LAB — HEMOGLOBIN A1C: Hgb A1c MFr Bld: 5.7 % (ref 4.6–6.5)

## 2023-12-27 LAB — MAGNESIUM: Magnesium: 2.1 mg/dL (ref 1.5–2.5)

## 2023-12-27 LAB — FOLATE: Folate: 6 ng/mL (ref >5.9)

## 2023-12-27 NOTE — Assessment & Plan Note (Signed)
 Symptoms are currently very well-controlled on regimen per psychiatry with Geodon  60 mg daily, trazodone  200 mg daily, and hydroxyzine  25 mg twice daily as needed.

## 2023-12-27 NOTE — Progress Notes (Signed)
 Brenda Nelson is a 68 y.o. female who presents today for an office visit.  She is a new patient.   Assessment/Plan:  New/Acute Problems: Cramps No red flags.  Overall reassuring exam.  We will check labs today including CBC, c-Met, magnesium, folate, and B12.  She does have history of spinal stenosis which could potentially be contributing.  If symptoms persist and above workup is negative would consider referral back to neurosurgery or further imaging at that time.  Chronic Problems Addressed Today: Bipolar disorder (HCC) Symptoms are currently very well-controlled on regimen per psychiatry with Geodon  60 mg daily, trazodone  200 mg daily, and hydroxyzine  25 mg twice daily as needed.  Seborrheic keratoses No red flags.  Reassured patient that these areas are benign.  Did discuss referral to dermatology however she declined.  Preventative health care Will order bone density scan.  She is up-to-date on other screenings and vaccines - need to obtain records from previous PCP.    Subjective:  HPI:  See assessment / plan for status of chronic conditions.   Discussed the use of AI scribe software for clinical note transcription with the patient, who gave verbal consent to proceed.  History of Present Illness Brenda Nelson is a 68 year old female with bipolar disorder who presents with concerns about medication management and muscle cramps.  She was diagnosed with bipolar disorder in 2005 and is currently on a medication regimen that includes trazodone  at night, Geodon  with meals, and hydroxyzine  as needed for anxiety. She has a virtual appointment with her psychiatrist from [facility], whom she has been seeing for years.  She experiences painful muscle cramps in her legs and hands, which have become more frequent over the past six months. Initially occurring sporadically, these cramps now happen more often and can occur at any time, including waking her at night. She  describes the sensation as similar to a 'Charlie horse'. No recent blood work has been done to investigate this issue. She reports numbness and tingling associated with the muscle cramps.  She mentions running out of calcium  supplements, which were previously recommended by her former doctor, although she has not been diagnosed with osteoporosis or osteopenia. It has been several years since her last bone density scan.  She has noticed an increase in flat moles on her chest and under her breasts, which she describes as appearing suddenly. These moles are not causing her any discomfort, but she is curious about their sudden appearance.   ROS: Per HPI, otherwise a complete review of systems was negative.   PMH:  The following were reviewed and entered/updated in epic: Past Medical History:  Diagnosis Date   Bipolar disorder [facility])    Patient Active Problem List   Diagnosis Date Noted   Seborrheic keratoses 12/27/2023   Bipolar disorder (HCC)    Spinal stenosis of cervical region 08/10/2021   Past Surgical History:  Procedure Laterality Date   ABDOMINAL HYSTERECTOMY     ANTERIOR CERVICAL DECOMP/DISCECTOMY FUSION N/A 08/10/2021   Procedure: Anterior Cervical Decompression Fusion  Cervical five-Cervical six - Cerival six-Cervical seven;  Surgeon: Onetha Kuba, MD;  Location: [facility] OR;  Service: Neurosurgery;  Laterality: N/A;   APPENDECTOMY     HERNIA REPAIR Left    inguinal   TONSILLECTOMY      Family History  Problem Relation Age of Onset   Heart disease Mother    Diabetes Father    Diabetes Daughter    Heart disease Maternal  Grandmother    Heart disease Maternal Grandfather    Heart disease Paternal Grandmother    Heart disease Paternal Grandfather     Medications- reviewed and updated Current Outpatient Medications  Medication Sig Dispense Refill   Calcium  Carb-Cholecalciferol  500-10 MG-MCG TABS Take 1 tablet by mouth daily.     hydrOXYzine  (ATARAX ) 25 MG tablet Take 25 mg by  mouth 2 (two) times daily.     traZODone  (DESYREL ) 100 MG tablet Take 200 mg by mouth at bedtime.     ziprasidone  (GEODON ) 60 MG capsule Take 60 mg by mouth daily with supper.     No current facility-administered medications for this visit.    Allergies-reviewed and updated Allergies  Allergen Reactions   Lamotrigine  Swelling    Lip swelling   Lurasidone Other (See Comments)    Social History   Socioeconomic History   Marital status: Married    Spouse name: Will   Number of children: 1   Years of education: Not on file   Highest education level: Not on file  Occupational History   Not on file  Tobacco Use   Smoking status: Never   Smokeless tobacco: Former  Building Services Engineer status: Never Used  Substance and Sexual Activity   Alcohol use: No   Drug use: No   Sexual activity: Not Currently  Other Topics Concern   Not on file  Social History Narrative   Not on file   Social Drivers of Health   Financial Resource Strain: Not on file  Food Insecurity: Not on file  Transportation Needs: Not on file  Physical Activity: Not on file  Stress: Not on file  Social Connections: Not on file           Objective:  Physical Exam: BP 110/70   Pulse 69   Temp 97.8 F (36.6 C) (Temporal)   Ht 5' 5 (1.651 m)   Wt 179 lb (81.2 kg)   SpO2 98%   BMI 29.79 kg/m   Gen: No acute distress, resting comfortably CV: Regular rate and rhythm with no murmurs appreciated Pulm: Normal work of breathing, clear to auscultation bilaterally with no crackles, wheezes, or rhonchi Skin: Scattered seborrheic keratosis noted on trunk and face Neuro: Grossly normal, moves all extremities Psych: Normal affect and thought content      Saket Hellstrom M. Kennyth, MD 12/27/2023 11:33 AM

## 2023-12-27 NOTE — Assessment & Plan Note (Signed)
 No red flags.  Reassured patient that these areas are benign.  Did discuss referral to dermatology however she declined.

## 2023-12-27 NOTE — Patient Instructions (Signed)
 It was very nice to see you today!  VISIT SUMMARY: During your visit, we discussed your medication management for bipolar disorder, muscle cramps, and new skin moles. We also reviewed your general health maintenance needs.  YOUR PLAN: BIPOLAR DISORDER: Your condition is well-controlled with your current medications. -Continue taking trazodone  at night, Geodon  with meals, and hydroxyzine  as needed for anxiety. -Continue long-term psychiatric care with Dr. Prentice.  PAINFUL MUSCLE CRAMPS AND SPASMS IN HANDS AND FEET: You have been experiencing more frequent muscle cramps, possibly due to a vitamin B deficiency or an electrolyte imbalance. -We will order blood work to check your vitamin B levels, electrolyte balance, cholesterol, blood sugar, kidney function, and thyroid function.  SEBORRHEIC KERATOSIS: You have noticed new flat moles on your chest and under your breasts. These are benign and not causing any discomfort. -No treatment is needed as they are not bothersome.  GENERAL HEALTH MAINTENANCE: We discussed routine health maintenance. -We will order a bone density scan at [facility] since your last scan was several years ago. -We will obtain records of your previous mammogram and colonoscopy. -Plan for an annual follow-up unless issues arise.  Return in about 1 year (around 12/26/2024) for Annual Physical.   Take care, Dr Kennyth  PLEASE NOTE:  If you had any lab tests, please let us  know if you have not heard back within a few days. You may see your results on mychart before we have a chance to review them but we will give you a call once they are reviewed by us .   If we ordered any referrals today, please let us  know if you have not heard from their office within the next week.   If you had any urgent prescriptions sent in today, please check with the pharmacy within an hour of our visit to make sure the prescription was transmitted appropriately.   Please try these tips to maintain  a healthy lifestyle:  Eat at least 3 REAL meals and 1-2 snacks per day.  Aim for no more than 5 hours between eating.  If you eat breakfast, please do so within one hour of getting up.   Each meal should contain half fruits/vegetables, one quarter protein, and one quarter carbs (no bigger than a computer mouse)  Cut down on sweet beverages. This includes juice, soda, and sweet tea.   Drink at least 1 glass of water with each meal and aim for at least 8 glasses per day  Exercise at least 150 minutes every week.    Preventive Care 31 Years and Older, Female Preventive care refers to lifestyle choices and visits with your health care provider that can promote health and wellness. Preventive care visits are also called wellness exams. What can I expect for my preventive care visit? Counseling Your health care provider may ask you questions about your: Medical history, including: Past medical problems. Family medical history. Pregnancy and menstrual history. History of falls. Current health, including: Memory and ability to understand (cognition). Emotional well-being. Home life and relationship well-being. Sexual activity and sexual health. Lifestyle, including: Alcohol, nicotine or tobacco, and drug use. Access to firearms. Diet, exercise, and sleep habits. Work and work astronomer. Sunscreen use. Safety issues such as seatbelt and bike helmet use. Physical exam Your health care provider will check your: Height and weight. These may be used to calculate your BMI (body mass index). BMI is a measurement that tells if you are at a healthy weight. Waist circumference. This measures the distance  around your waistline. This measurement also tells if you are at a healthy weight and may help predict your risk of certain diseases, such as type 2 diabetes and high blood pressure. Heart rate and blood pressure. Body temperature. Skin for abnormal spots. What immunizations do I  need?  Vaccines are usually given at various ages, according to a schedule. Your health care provider will recommend vaccines for you based on your age, medical history, and lifestyle or other factors, such as travel or where you work. What tests do I need? Screening Your health care provider may recommend screening tests for certain conditions. This may include: Lipid and cholesterol levels. Hepatitis C test. Hepatitis B test. HIV (human immunodeficiency virus) test. STI (sexually transmitted infection) testing, if you are at risk. Lung cancer screening. Colorectal cancer screening. Diabetes screening. This is done by checking your blood sugar (glucose) after you have not eaten for a while (fasting). Mammogram. Talk with your health care provider about how often you should have regular mammograms. BRCA-related cancer screening. This may be done if you have a family history of breast, ovarian, tubal, or peritoneal cancers. Bone density scan. This is done to screen for osteoporosis. Talk with your health care provider about your test results, treatment options, and if necessary, the need for more tests. Follow these instructions at home: Eating and drinking  Eat a diet that includes fresh fruits and vegetables, whole grains, lean protein, and low-fat dairy products. Limit your intake of foods with high amounts of sugar, saturated fats, and salt. Take vitamin and mineral supplements as recommended by your health care provider. Do not drink alcohol if your health care provider tells you not to drink. If you drink alcohol: Limit how much you have to 0-1 drink a day. Know how much alcohol is in your drink. In the [place] one drink equals one 12 oz bottle of beer (355 mL), one 5 oz glass of wine (148 mL), or one 1 oz glass of hard liquor (44 mL). Lifestyle Brush your teeth every morning and night with fluoride toothpaste. Floss one time each day. Exercise for at least 30 minutes 5 or more days  each week. Do not use any products that contain nicotine or tobacco. These products include cigarettes, chewing tobacco, and vaping devices, such as e-cigarettes. If you need help quitting, ask your health care provider. Do not use drugs. If you are sexually active, practice safe sex. Use a condom or other form of protection in order to prevent STIs. Take aspirin only as told by your health care provider. Make sure that you understand how much to take and what form to take. Work with your health care provider to find out whether it is safe and beneficial for you to take aspirin daily. Ask your health care provider if you need to take a cholesterol-lowering medicine (statin). Find healthy ways to manage stress, such as: Meditation, yoga, or listening to music. Journaling. Talking to a trusted person. Spending time with friends and family. Minimize exposure to UV radiation to reduce your risk of skin cancer. Safety Always wear your seat belt while driving or riding in a vehicle. Do not drive: If you have been drinking alcohol. Do not ride with someone who has been drinking. When you are tired or distracted. While texting. If you have been using any mind-altering substances or drugs. Wear a helmet and other protective equipment during sports activities. If you have firearms in your house, make sure you follow all gun safety  procedures. What's next? Visit your health care provider once a year for an annual wellness visit. Ask your health care provider how often you should have your eyes and teeth checked. Stay up to date on all vaccines. This information is not intended to replace advice given to you by your health care provider. Make sure you discuss any questions you have with your health care provider. Document Revised: 08/04/2020 Document Reviewed: 08/04/2020 Elsevier Patient Education  2024 [employer].

## 2023-12-28 ENCOUNTER — Ambulatory Visit: Payer: Self-pay | Admitting: Family Medicine

## 2023-12-28 NOTE — Progress Notes (Signed)
 Her cholesterol is very elevated.  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  We should probably start a cholesterol medication to improve these numbers and lower risk of heart attack and stroke.  Lease send in Lipitor 40 mg daily if she is agreeable.  It is also very important that she continue to work on diet and exercise.  We should recheck in 3 to 6 months.  Her B12 was on the lower range of normal.  I believe this is probably what is causing some of the cramping issues that she was having.  Recommend she start B12 supplementation 1000 mcg daily.  We should recheck in 3 months.  She should let us  know if symptoms do not improve with this.  All of her other labs are at goal and we can recheck everything in a year.

## 2023-12-31 ENCOUNTER — Other Ambulatory Visit: Payer: Self-pay | Admitting: *Deleted

## 2023-12-31 MED ORDER — ATORVASTATIN CALCIUM 40 MG PO TABS: 40.0000 mg | ORAL_TABLET | Freq: Every day | ORAL | 3 refills | Status: AC

## 2023-12-31 NOTE — Telephone Encounter (Signed)
Rx Lipitor send to [facility]

## 2024-02-26 ENCOUNTER — Ambulatory Visit (INDEPENDENT_AMBULATORY_CARE_PROVIDER_SITE_OTHER)

## 2024-02-26 VITALS — Ht 65.0 in | Wt 182.0 lb

## 2024-02-26 DIAGNOSIS — Z0000 Encounter for general adult medical examination without abnormal findings: Secondary | ICD-10-CM

## 2024-02-26 NOTE — Progress Notes (Signed)
 "  Chief Complaint  Patient presents with   Medicare Wellness     Subjective:   Brenda Nelson is a 69 y.o. female who presents for a Medicare Annual Wellness Visit.  Visit info / Clinical Intake: Medicare Wellness Visit Type:: Initial Annual Wellness Visit Persons participating in visit and providing information:: patient Medicare Wellness Visit Mode:: Telephone If telephone:: video declined Since this visit was completed virtually, some vitals may be partially provided or unavailable. Missing vitals are due to the limitations of the virtual format.: Documented vitals are patient reported If Telephone or Video please confirm:: I connected with patient using audio/video enable telemedicine. I verified patient identity with two identifiers, discussed telehealth limitations, and patient agreed to proceed. Patient Location:: home Provider Location:: home office Interpreter Needed?: No Pre-visit prep was completed: yes AWV questionnaire completed by patient prior to visit?: yes Date:: 02/22/24 Living arrangements:: (Patient-Rptd) lives with spouse/significant other Patient's Overall Health Status Rating: (Patient-Rptd) very good Typical amount of pain: (Patient-Rptd) none Does pain affect daily life?: (Patient-Rptd) no Are you currently prescribed opioids?: no  Dietary Habits and Nutritional Risks How many meals a day?: (Patient-Rptd) 3 Eats fruit and vegetables daily?: (Patient-Rptd) yes Most meals are obtained by: (Patient-Rptd) preparing own meals In the last 2 weeks, have you had any of the following?: none Diabetic:: no  Functional Status Activities of Daily Living (to include ambulation/medication): (Patient-Rptd) Independent Ambulation: (Patient-Rptd) Independent Medication Administration: (Patient-Rptd) Independent Home Management (perform basic housework or laundry): (Patient-Rptd) Independent Manage your own finances?: (Patient-Rptd) yes Primary transportation is:  (Patient-Rptd) driving Concerns about vision?: no *vision screening is required for WTM* Concerns about hearing?: no  Fall Screening Falls in the past year?: (Patient-Rptd) 0 Number of falls in past year: 0 Was there an injury with Fall?: 0 Fall Risk Category Calculator: 0 Patient Fall Risk Level: Low Fall Risk  Fall Risk Patient at Risk for Falls Due to: Medication side effect Fall risk Follow up: Falls prevention discussed; Falls evaluation completed  Home and Transportation Safety: All rugs have non-skid backing?: (Patient-Rptd) yes All stairs or steps have railings?: (Patient-Rptd) yes Grab bars in the bathtub or shower?: (Patient-Rptd) yes Have non-skid surface in bathtub or shower?: (Patient-Rptd) yes Good home lighting?: (Patient-Rptd) yes Regular seat belt use?: (Patient-Rptd) yes Hospital stays in the last year:: (Patient-Rptd) no  Cognitive Assessment Difficulty concentrating, remembering, or making decisions? : (Patient-Rptd) no Will 6CIT or Mini Cog be Completed: yes What year is it?: 0 points What month is it?: 0 points Give patient an address phrase to remember (5 components): [address] [place] About what time is it?: 0 points Count backwards from 20 to 1: 0 points Say the months of the year in reverse: 0 points Repeat the address phrase from earlier: 4 points 6 CIT Score: 4 points  Advance Directives (For Healthcare) Does Patient Have a Medical Advance Directive?: No Would patient like information on creating a medical advance directive?: No - Patient declined  Reviewed/Updated  Reviewed/Updated: Reviewed All (Medical, Surgical, Family, Medications, Allergies, Care Teams, Patient Goals)    Allergies (verified) Lamotrigine  and Lurasidone   Current Medications (verified) Outpatient Encounter Medications as of 02/26/2024  Medication Sig   atorvastatin  (LIPITOR) 40 MG tablet Take 1 tablet (40 mg total) by mouth daily.   Calcium   Carb-Cholecalciferol  500-10 MG-MCG TABS Take 1 tablet by mouth daily.   hydrOXYzine  (ATARAX ) 25 MG tablet Take 25 mg by mouth 2 (two) times daily.   traZODone  (DESYREL ) 100 MG tablet  Take 200 mg by mouth at bedtime.   ziprasidone  (GEODON ) 60 MG capsule Take 60 mg by mouth daily with supper.   No facility-administered encounter medications on file as of 02/26/2024.    History: Past Medical History:  Diagnosis Date   Bipolar disorder [facility])    Past Surgical History:  Procedure Laterality Date   ABDOMINAL HYSTERECTOMY     ANTERIOR CERVICAL DECOMP/DISCECTOMY FUSION N/A 08/10/2021   Procedure: Anterior Cervical Decompression Fusion  Cervical five-Cervical six - Cerival six-Cervical seven;  Surgeon: Onetha Kuba, MD;  Location: [facility] OR;  Service: Neurosurgery;  Laterality: N/A;   APPENDECTOMY     HERNIA REPAIR Left    inguinal   TONSILLECTOMY     Family History  Problem Relation Age of Onset   Heart disease Mother    Diabetes Father    Diabetes Daughter    Heart disease Maternal Grandmother    Heart disease Maternal Grandfather    Heart disease Paternal Grandmother    Heart disease Paternal Grandfather    Social History   Occupational History   Not on file  Tobacco Use   Smoking status: Never   Smokeless tobacco: Former  Building Services Engineer status: Never Used  Substance and Sexual Activity   Alcohol use: No   Drug use: No   Sexual activity: Not Currently   Tobacco Counseling Counseling given: Not Answered  SDOH Screenings   Food Insecurity: Food Insecurity Present (02/22/2024)  Housing: High Risk (02/22/2024)  Transportation Needs: No Transportation Needs (02/22/2024)  Utilities: Not At Risk (02/26/2024)  Alcohol Screen: Low Risk (02/22/2024)  Depression (PHQ2-9): Low Risk (02/26/2024)  Financial Resource Strain: Medium Risk (02/22/2024)  Physical Activity: Inactive (02/22/2024)  Social Connections: Moderately Integrated (02/22/2024)  Stress: No Stress Concern Present (02/22/2024)   Tobacco Use: Medium Risk (02/26/2024)  Health Literacy: Adequate Health Literacy (02/26/2024)   See flowsheets for full screening details  Depression Screen PHQ 2 & 9 Depression Scale- Over the past 2 weeks, how often have you been bothered by any of the following problems? Little interest or pleasure in doing things: 0 Feeling down, depressed, or hopeless (PHQ Adolescent also includes...irritable): 0 PHQ-2 Total Score: 0 Trouble falling or staying asleep, or sleeping too much: 0 Feeling tired or having little energy: 0 Poor appetite or overeating (PHQ Adolescent also includes...weight loss): 0 Feeling bad about yourself - or that you are a failure or have let yourself or your family down: 0 Trouble concentrating on things, such as reading the newspaper or watching television (PHQ Adolescent also includes...like school work): 0 Moving or speaking so slowly that other people could have noticed. Or the opposite - being so fidgety or restless that you have been moving around a lot more than usual: 0 Thoughts that you would be better off dead, or of hurting yourself in some way: 0 PHQ-9 Total Score: 0 If you checked off any problems, how difficult have these problems made it for you to do your work, take care of things at home, or get along with other people?: Not difficult at all  Depression Treatment Depression Interventions/Treatment : [id number] Score <4 Follow-up Not Indicated     Goals Addressed             This Visit's Progress    Patient Stated       02/26/2024, stay healthy             Objective:    Today's Vitals   02/26/24 1317  Weight: 182  lb (82.6 kg)  Height: 5' 5 (1.651 m)   Body mass index is 30.29 kg/m.  Hearing/Vision screen Hearing Screening - Comments:: Denies hearing issues Vision Screening - Comments:: Regular eye exams Immunizations and Health Maintenance Health Maintenance  Topic Date Due   Hepatitis C Screening  Never done   Zoster Vaccines-  Shingrix (1 of 2) Never done   Colonoscopy  Never done   Pneumococcal Vaccine: 50+ Years (1 of 1 - PCV) Never done   Mammogram  07/08/2018   COVID-19 Vaccine (6 - 2025-26 season) 10/22/2023   Medicare Annual Wellness (AWV)  02/25/2025   DTaP/Tdap/Td (2 - Td or Tdap) 11/20/2033   Influenza Vaccine  Completed   Bone Density Scan  Completed   Meningococcal B Vaccine  Aged Out        Assessment/Plan:  This is a routine wellness examination for Brenda Nelson.  Patient Care Team: Kennyth Worth HERO, MD as PCP - General (Family Medicine) Octavia Bruckner, MD as Consulting Physician (Ophthalmology) Elner Arley LABOR, MD as Consulting Physician (Ophthalmology)  I have personally reviewed and noted the following in the patients chart:   Medical and social history Use of alcohol, tobacco or illicit drugs  Current medications and supplements including opioid prescriptions. Functional ability and status Nutritional status Physical activity Advanced directives List of other physicians Hospitalizations, surgeries, and ER visits in previous 12 months Vitals Screenings to include cognitive, depression, and falls Referrals and appointments  No orders of the defined types were placed in this encounter.  In addition, I have reviewed and discussed with patient certain preventive protocols, quality metrics, and best practice recommendations. A written personalized care plan for preventive services as well as general preventive health recommendations were provided to patient.   Brenda FORBES Dawn, LPN   09/23/7971   Return in 1 year (on 02/25/2025).  After Visit Summary: (MyChart) Due to this being a telephonic visit, the after visit summary with patients personalized plan was offered to patient via MyChart   Nurse Notes: HM Addressed: Requested report for mammogram. States she had colonoscopy about 3 years ago and is not due. Due for Hep C screening. States had pneumonia and shingles vaccine.  "

## 2024-02-26 NOTE — Patient Instructions (Signed)
 Brenda Nelson,  Thank you for taking the time for your Medicare Wellness Visit. I appreciate your continued commitment to your health goals. Please review the care plan we discussed, and feel free to reach out if I can assist you further.  Please note that Annual Wellness Visits do not include a physical exam. Some assessments may be limited, especially if the visit was conducted virtually. If needed, we may recommend an in-person follow-up with your provider.  Ongoing Care Seeing your primary care provider every 3 to 6 months helps us  monitor your health and provide consistent, personalized care.   Referrals If a referral was made during today's visit and you haven't received any updates within two weeks, please contact the referred provider directly to check on the status.  Recommended Screenings:  Health Maintenance  Topic Date Due   Hepatitis C Screening  Never done   Zoster (Shingles) Vaccine (1 of 2) Never done   Colon Cancer Screening  Never done   Pneumococcal Vaccine for age over 49 (1 of 1 - PCV) Never done   Medicare Annual Wellness Visit  10/23/2015   Breast Cancer Screening  07/08/2018   COVID-19 Vaccine (6 - 2025-26 season) 10/22/2023   DTaP/Tdap/Td vaccine (2 - Td or Tdap) 11/20/2033   Flu Shot  Completed   Osteoporosis screening with Bone Density Scan  Completed   Meningitis B Vaccine  Aged Out       02/22/2024   11:10 AM  Advanced Directives  Does Patient Have a Medical Advance Directive? No  Would patient like information on creating a medical advance directive? No - Patient declined    Vision: Annual vision screenings are recommended for early detection of glaucoma, cataracts, and diabetic retinopathy. These exams can also reveal signs of chronic conditions such as diabetes and high blood pressure.  Dental: Annual dental screenings help detect early signs of oral cancer, gum disease, and other conditions linked to overall health, including heart disease and  diabetes.  Please see the attached documents for additional preventive care recommendations.

## 2024-02-27 ENCOUNTER — Encounter: Payer: Self-pay | Admitting: Family Medicine

## 2024-12-31 ENCOUNTER — Encounter: Admitting: Internal Medicine

## 2025-02-26 ENCOUNTER — Ambulatory Visit
